# Patient Record
Sex: Female | Born: 1980 | Race: White | Hispanic: No | Marital: Married | Smoking: Former smoker
Health system: Southern US, Community
[De-identification: ages and names within clinical notes are randomized; demographics above are authoritative.]

## PROBLEM LIST (undated history)

## (undated) ENCOUNTER — Inpatient Hospital Stay (HOSPITAL_COMMUNITY): Payer: Self-pay

## (undated) DIAGNOSIS — I10 Essential (primary) hypertension: Secondary | ICD-10-CM

## (undated) DIAGNOSIS — O1410 Severe pre-eclampsia, unspecified trimester: Secondary | ICD-10-CM

## (undated) DIAGNOSIS — E079 Disorder of thyroid, unspecified: Secondary | ICD-10-CM

## (undated) DIAGNOSIS — E063 Autoimmune thyroiditis: Secondary | ICD-10-CM

## (undated) DIAGNOSIS — R87629 Unspecified abnormal cytological findings in specimens from vagina: Secondary | ICD-10-CM

## (undated) DIAGNOSIS — F419 Anxiety disorder, unspecified: Secondary | ICD-10-CM

## (undated) HISTORY — DX: Disorder of thyroid, unspecified: E07.9

## (undated) HISTORY — PX: COSMETIC SURGERY: SHX468

## (undated) HISTORY — DX: Anxiety disorder, unspecified: F41.9

## (undated) HISTORY — PX: AUGMENTATION MAMMAPLASTY: SUR837

## (undated) HISTORY — PX: BREAST SURGERY: SHX581

## (undated) HISTORY — DX: Autoimmune thyroiditis: E06.3

## (undated) HISTORY — DX: Severe pre-eclampsia, unspecified trimester: O14.10

---
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
## 2003-04-14 ENCOUNTER — Emergency Department (HOSPITAL_COMMUNITY): Admission: EM | Admit: 2003-04-14 | Discharge: 2003-04-14 | Payer: Self-pay | Admitting: Emergency Medicine

## 2004-04-26 ENCOUNTER — Other Ambulatory Visit: Admission: RE | Admit: 2004-04-26 | Discharge: 2004-04-26 | Payer: Self-pay | Admitting: Family Medicine

## 2005-02-15 ENCOUNTER — Other Ambulatory Visit: Admission: RE | Admit: 2005-02-15 | Discharge: 2005-02-15 | Payer: Self-pay | Admitting: Obstetrics and Gynecology

## 2006-03-27 ENCOUNTER — Other Ambulatory Visit: Admission: RE | Admit: 2006-03-27 | Discharge: 2006-03-27 | Payer: Self-pay | Admitting: Obstetrics & Gynecology

## 2007-04-18 ENCOUNTER — Other Ambulatory Visit: Admission: RE | Admit: 2007-04-18 | Discharge: 2007-04-18 | Payer: Self-pay | Admitting: Obstetrics & Gynecology

## 2008-05-01 ENCOUNTER — Other Ambulatory Visit: Admission: RE | Admit: 2008-05-01 | Discharge: 2008-05-01 | Payer: Self-pay | Admitting: Obstetrics & Gynecology

## 2010-04-21 ENCOUNTER — Other Ambulatory Visit (HOSPITAL_COMMUNITY)
Admission: RE | Admit: 2010-04-21 | Discharge: 2010-04-21 | Disposition: A | Discharge status: Home / Self Care | Payer: BC Managed Care – PPO | Source: Ambulatory Visit | Attending: Obstetrics and Gynecology | Admitting: Obstetrics and Gynecology

## 2010-04-21 ENCOUNTER — Other Ambulatory Visit: Payer: Self-pay | Admitting: Obstetrics and Gynecology

## 2010-04-21 DIAGNOSIS — Z01419 Encounter for gynecological examination (general) (routine) without abnormal findings: Secondary | ICD-10-CM | POA: Insufficient documentation

## 2010-04-21 DIAGNOSIS — Z113 Encounter for screening for infections with a predominantly sexual mode of transmission: Secondary | ICD-10-CM | POA: Insufficient documentation

## 2010-04-21 DIAGNOSIS — Z1159 Encounter for screening for other viral diseases: Secondary | ICD-10-CM | POA: Insufficient documentation

## 2011-04-27 ENCOUNTER — Other Ambulatory Visit: Payer: Self-pay | Admitting: Obstetrics and Gynecology

## 2011-04-27 ENCOUNTER — Other Ambulatory Visit (HOSPITAL_COMMUNITY)
Admission: RE | Admit: 2011-04-27 | Discharge: 2011-04-27 | Disposition: A | Discharge status: Home / Self Care | Payer: BC Managed Care – PPO | Source: Ambulatory Visit | Attending: Obstetrics and Gynecology | Admitting: Obstetrics and Gynecology

## 2011-04-27 DIAGNOSIS — Z01419 Encounter for gynecological examination (general) (routine) without abnormal findings: Secondary | ICD-10-CM | POA: Insufficient documentation

## 2012-06-30 ENCOUNTER — Emergency Department: Payer: Self-pay | Admitting: Emergency Medicine

## 2012-06-30 LAB — COMPREHENSIVE METABOLIC PANEL
Albumin: 3.9 g/dL (ref 3.4–5.0)
Alkaline Phosphatase: 61 U/L (ref 50–136)
Bilirubin,Total: 0.8 mg/dL (ref 0.2–1.0)
Co2: 24 mmol/L (ref 21–32)
Creatinine: 0.59 mg/dL — ABNORMAL LOW (ref 0.60–1.30)
EGFR (African American): >60
Glucose: 120 mg/dL — ABNORMAL HIGH (ref 65–99)
SGOT(AST): 31 U/L (ref 15–37)
SGPT (ALT): 41 U/L (ref 12–78)
Total Protein: 7.1 g/dL (ref 6.4–8.2)

## 2012-06-30 LAB — URINALYSIS, COMPLETE
Bacteria: NONE SEEN
Bilirubin,UR: NEGATIVE
Blood: NEGATIVE
Glucose,UR: NEGATIVE mg/dL (ref 0–75)
Leukocyte Esterase: NEGATIVE
Nitrite: NEGATIVE
Protein: NEGATIVE
RBC,UR: 2 /HPF (ref 0–5)
Squamous Epithelial: 3
WBC UR: 1 /HPF (ref 0–5)

## 2012-06-30 LAB — CBC
HCT: 38.5 % (ref 35.0–47.0)
MCV: 92 fL (ref 80–100)
RBC: 4.18 10*6/uL (ref 3.80–5.20)
WBC: 8.5 10*3/uL (ref 3.6–11.0)

## 2012-11-26 ENCOUNTER — Other Ambulatory Visit (HOSPITAL_COMMUNITY)
Admission: RE | Admit: 2012-11-26 | Discharge: 2012-11-26 | Disposition: A | Discharge status: Home / Self Care | Payer: 59 | Source: Ambulatory Visit | Attending: Obstetrics and Gynecology | Admitting: Obstetrics and Gynecology

## 2012-11-26 ENCOUNTER — Other Ambulatory Visit: Payer: Self-pay | Admitting: Obstetrics and Gynecology

## 2012-11-26 DIAGNOSIS — Z01419 Encounter for gynecological examination (general) (routine) without abnormal findings: Secondary | ICD-10-CM | POA: Insufficient documentation

## 2013-01-02 LAB — OB RESULTS CONSOLE RPR: RPR: NONREACTIVE

## 2013-01-02 LAB — OB RESULTS CONSOLE ABO/RH: RH Type: POSITIVE

## 2013-01-02 LAB — OB RESULTS CONSOLE HIV ANTIBODY (ROUTINE TESTING): HIV: NONREACTIVE

## 2013-01-03 LAB — OB RESULTS CONSOLE RUBELLA ANTIBODY, IGM: Rubella: IMMUNE

## 2013-01-27 LAB — OB RESULTS CONSOLE GC/CHLAMYDIA
Chlamydia: NEGATIVE
Gonorrhea: NEGATIVE

## 2013-06-02 LAB — OB RESULTS CONSOLE RPR: RPR: NONREACTIVE

## 2013-07-29 ENCOUNTER — Observation Stay: Payer: Self-pay | Admitting: Obstetrics and Gynecology

## 2013-08-04 LAB — OB RESULTS CONSOLE GBS: GBS: NEGATIVE

## 2013-08-19 ENCOUNTER — Encounter (HOSPITAL_COMMUNITY): Payer: Self-pay | Admitting: *Deleted

## 2013-08-19 ENCOUNTER — Inpatient Hospital Stay (HOSPITAL_COMMUNITY)
Admission: AD | Admit: 2013-08-19 | Discharge: 2013-08-23 | DRG: 765 | Disposition: A | Discharge status: Home / Self Care | Payer: 59 | Source: Ambulatory Visit | Attending: Obstetrics and Gynecology | Admitting: Obstetrics and Gynecology

## 2013-08-19 ENCOUNTER — Other Ambulatory Visit: Payer: Self-pay | Admitting: Obstetrics and Gynecology

## 2013-08-19 DIAGNOSIS — O411090 Infection of amniotic sac and membranes, unspecified, unspecified trimester, not applicable or unspecified: Secondary | ICD-10-CM | POA: Diagnosis present

## 2013-08-19 DIAGNOSIS — D649 Anemia, unspecified: Secondary | ICD-10-CM | POA: Diagnosis present

## 2013-08-19 DIAGNOSIS — Z98891 History of uterine scar from previous surgery: Secondary | ICD-10-CM

## 2013-08-19 DIAGNOSIS — O139 Gestational [pregnancy-induced] hypertension without significant proteinuria, unspecified trimester: Secondary | ICD-10-CM | POA: Diagnosis present

## 2013-08-19 DIAGNOSIS — O9903 Anemia complicating the puerperium: Secondary | ICD-10-CM | POA: Diagnosis present

## 2013-08-19 DIAGNOSIS — O411290 Chorioamnionitis, unspecified trimester, not applicable or unspecified: Secondary | ICD-10-CM | POA: Diagnosis not present

## 2013-08-19 HISTORY — DX: Unspecified abnormal cytological findings in specimens from vagina: R87.629

## 2013-08-19 LAB — COMPREHENSIVE METABOLIC PANEL
ALBUMIN: 2.2 g/dL — AB (ref 3.5–5.2)
ALT: 17 U/L (ref 0–35)
AST: 29 U/L (ref 0–37)
Alkaline Phosphatase: 106 U/L (ref 39–117)
BUN: 11 mg/dL (ref 6–23)
CALCIUM: 9 mg/dL (ref 8.4–10.5)
CO2: 19 mEq/L (ref 19–32)
CREATININE: 0.63 mg/dL (ref 0.50–1.10)
Chloride: 102 mEq/L (ref 96–112)
GFR calc Af Amer: >90 mL/min (ref >90)
GFR calc non Af Amer: >90 mL/min (ref >90)
Glucose, Bld: 90 mg/dL (ref 70–99)
POTASSIUM: 4.5 meq/L (ref 3.7–5.3)
Sodium: 135 mEq/L — ABNORMAL LOW (ref 137–147)
Total Bilirubin: 0.2 mg/dL — ABNORMAL LOW (ref 0.3–1.2)
Total Protein: 5.7 g/dL — ABNORMAL LOW (ref 6.0–8.3)

## 2013-08-19 LAB — CBC
HEMATOCRIT: 34.6 % — AB (ref 36.0–46.0)
HEMOGLOBIN: 11.9 g/dL — AB (ref 12.0–15.0)
MCH: 32.3 pg (ref 26.0–34.0)
MCHC: 34.4 g/dL (ref 30.0–36.0)
MCV: 94 fL (ref 78.0–100.0)
Platelets: 167 10*3/uL (ref 150–400)
RBC: 3.68 MIL/uL — AB (ref 3.87–5.11)
RDW: 13.5 % (ref 11.5–15.5)
WBC: 15.3 10*3/uL — AB (ref 4.0–10.5)

## 2013-08-19 LAB — ABO/RH: ABO/RH(D): A POS

## 2013-08-19 LAB — PROTEIN / CREATININE RATIO, URINE
CREATININE, URINE: 50.64 mg/dL
PROTEIN CREATININE RATIO: 0.09 (ref 0.00–0.15)
TOTAL PROTEIN, URINE: 4.8 mg/dL

## 2013-08-19 LAB — RPR

## 2013-08-19 LAB — TYPE AND SCREEN
ABO/RH(D): A POS
Antibody Screen: NEGATIVE

## 2013-08-19 LAB — URIC ACID: URIC ACID, SERUM: 6 mg/dL (ref 2.4–7.0)

## 2013-08-19 MED ORDER — FLEET ENEMA 7-19 GM/118ML RE ENEM: 1.0000 | ENEMA | RECTAL | Status: DC | PRN

## 2013-08-19 MED ORDER — MISOPROSTOL 25 MCG QUARTER TABLET
25.0000 ug | ORAL_TABLET | ORAL | Status: DC | PRN
  Administered 2013-08-19 (×2): 25 ug via VAGINAL
  Filled 2013-08-19 (×3): qty 0.25

## 2013-08-19 MED ORDER — OXYTOCIN BOLUS FROM INFUSION: 500.0000 mL | INTRAVENOUS | Status: DC

## 2013-08-19 MED ORDER — OXYCODONE-ACETAMINOPHEN 5-325 MG PO TABS: 1.0000 | ORAL_TABLET | ORAL | Status: DC | PRN

## 2013-08-19 MED ORDER — IBUPROFEN 600 MG PO TABS: 600.0000 mg | ORAL_TABLET | Freq: Four times a day (QID) | ORAL | Status: DC | PRN

## 2013-08-19 MED ORDER — LIDOCAINE HCL (PF) 1 % IJ SOLN: 30.0000 mL | INTRAMUSCULAR | Status: DC | PRN

## 2013-08-19 MED ORDER — ACETAMINOPHEN 325 MG PO TABS: 650.0000 mg | ORAL_TABLET | ORAL | Status: DC | PRN

## 2013-08-19 MED ORDER — CITRIC ACID-SODIUM CITRATE 334-500 MG/5ML PO SOLN
30.0000 mL | ORAL | Status: DC | PRN
  Administered 2013-08-20: 30 mL via ORAL
  Filled 2013-08-19: qty 15

## 2013-08-19 MED ORDER — ONDANSETRON HCL 4 MG/2ML IJ SOLN: 4.0000 mg | Freq: Four times a day (QID) | INTRAMUSCULAR | Status: DC | PRN

## 2013-08-19 MED ORDER — LACTATED RINGERS IV SOLN
500.0000 mL | INTRAVENOUS | Status: DC | PRN
  Administered 2013-08-20: 500 mL via INTRAVENOUS

## 2013-08-19 MED ORDER — OXYTOCIN 40 UNITS IN LACTATED RINGERS INFUSION - SIMPLE MED: 62.5000 mL/h | INTRAVENOUS | Status: DC

## 2013-08-19 MED ORDER — TERBUTALINE SULFATE 1 MG/ML IJ SOLN: 0.2500 mg | Freq: Once | INTRAMUSCULAR | Status: AC | PRN

## 2013-08-19 MED ORDER — LACTATED RINGERS IV SOLN
INTRAVENOUS | Status: DC
  Administered 2013-08-19 – 2013-08-20 (×6): via INTRAVENOUS

## 2013-08-19 NOTE — H&P (Signed)
Melinda [name] is a 33 y.o. female G2P0010 at 38 wks anDoreene Nestd 2 days by LMP of 11/24/2013 confirmed by  8 wk ultrasound with EDD 08/31/2013.  Prenatal care at [facility] with Dr. Richardson Doppole.  Pt presented to the office with BP 152/ 96  She was sent to labor and delivery for induction due to gestational hypertension. She denis headache blurred vision or ruq pain. + FM no contractions no lof. No vaginal bleeding.     Maternal Medical History:  Fetal activity: Perceived fetal activity is normal.   Last perceived fetal movement was within the past hour.      OB History   Grav Para Term Preterm Abortions TAB SAB Ect Mult Living   1              Current pregnancy.. Pt entered care at 8 wks ...  She had an ultrasound 08/05/2013 EFW was 6 lb 8 oz ( 70%ile) AFI 17 Vertex presentation.    PMH negative   History reviewed. No pertinent past medical history. Past Surgical History  Procedure Laterality Date  . Breast surgery      implants   Family History: family history includes Alcohol abuse in her father; Cancer in her maternal grandmother and paternal grandmother; Varicose Veins in her maternal grandmother. There is no history of Arthritis, Asthma, Birth defects, COPD, Depression, Diabetes, Drug abuse, Early death, Hearing loss, Heart disease, Hyperlipidemia, Hypertension, Kidney disease, Learning disabilities, Mental illness, Mental retardation, Miscarriages / Stillbirths, Stroke, or Vision loss. Social History:  reports that she has never smoked. She has never used smokeless tobacco. She reports that she does not drink alcohol or use illicit drugs.   Prenatal Transfer Tool  Maternal Diabetes: No Genetic Screening: Normal Maternal Ultrasounds/Referrals: Normal Fetal Ultrasounds or other Referrals:  None Maternal Substance Abuse:  No Significant Maternal Medications:  None Significant Maternal Lab Results:  Lab values include: Group B Strep negative Other Comments:  None  ROS  Dilation:  1 Effacement (%): 50 Station: -3 Exam by:: Camelia EngK. Haynes, RN Blood pressure 142/84, pulse 77, temperature 98.4 F (36.9 C), temperature source Oral, resp. rate 20, height 5\' 7"  (1.702 m), weight 105.688 kg (233 lb), last menstrual period 11/24/2012. Maternal Exam:  Uterine Assessment: Contraction strength is mild.  Contraction frequency is irregular and rare.   Abdomen: Patient reports no abdominal tenderness. Fundal height is 38.   Estimated fetal weight is 7 1/2 to 8 lbs .   Fetal presentation: vertex  Introitus: Normal vulva. Normal vagina.  Pelvis: adequate for delivery.   Cervix: Cervix evaluated by digital exam.     Fetal Exam Fetal Monitor Review: Baseline rate: 140.  Variability: moderate (6-25 bpm).   Pattern: accelerations present and no decelerations.    Fetal State Assessment: Category I - tracings are normal.     Physical Exam  Vitals reviewed. Constitutional: She is oriented to person, place, and time. She appears well-developed and well-nourished.  HENT:  Head: Normocephalic and atraumatic.  Neck: Normal range of motion.  Cardiovascular: Normal rate and regular rhythm.   Respiratory: Effort normal and breath sounds normal.  Genitourinary: Vagina normal.  Musculoskeletal: Normal range of motion.  Neurological: She is alert and oriented to person, place, and time.  Skin: Skin is warm and dry.  Psychiatric: She has a normal mood and affect.    Prenatal labs: ABO, Rh: --/--/A POS, A POS (06/23 1110) Antibody: NEG (06/23 1110) Rubella:  immune   RPR:  Nonreactive   HBsAg:  Negative  HIV:   Nonreactive  GBS:   Negative   Assessment/Plan: 38 wks and 2 days with gestational hypertension... Will check PIH labs to rule out preeclampsia. Admit to labor and delivery for induction. Increased r/o cesarean section with induction was discussed and she accepts this risk  Plan cytotec for cervical ripening  Anticipate SVD Dr. Charlotta Newtonzan covering after 5 pm on  08/19/2013   COLE,TARA J. 08/19/2013, 4:18 PM

## 2013-08-19 NOTE — Progress Notes (Signed)
Reported more than 3 UCs in 10 minutes so unable to place cytotec. Patient rates them mild 3/10 pain. Reported baseline returned to 145 after baseline of 155-165. Orders received to continue to try to place cytotec per UC pattern until 3am. If unable to place cytotec start pitocin at 5am.

## 2013-08-19 NOTE — Progress Notes (Signed)
Notified patient request to ambulate. Dr. Charlotta Newtonzan said patient can ambulate until 10:15 and then assess UC pattern for  cytotec placement by 11pm.

## 2013-08-20 ENCOUNTER — Inpatient Hospital Stay (HOSPITAL_COMMUNITY): Payer: 59 | Admitting: Anesthesiology

## 2013-08-20 ENCOUNTER — Encounter (HOSPITAL_COMMUNITY): Payer: Self-pay | Admitting: Anesthesiology

## 2013-08-20 ENCOUNTER — Encounter (HOSPITAL_COMMUNITY): Payer: 59 | Admitting: Anesthesiology

## 2013-08-20 ENCOUNTER — Encounter (HOSPITAL_COMMUNITY)
Admission: AD | Disposition: A | Discharge status: Home / Self Care | Payer: Self-pay | Source: Ambulatory Visit | Attending: Obstetrics and Gynecology

## 2013-08-20 LAB — CBC
HCT: 34.1 % — ABNORMAL LOW (ref 36.0–46.0)
HCT: 34.3 % — ABNORMAL LOW (ref 36.0–46.0)
HCT: 34.4 % — ABNORMAL LOW (ref 36.0–46.0)
HEMOGLOBIN: 11.9 g/dL — AB (ref 12.0–15.0)
Hemoglobin: 11.5 g/dL — ABNORMAL LOW (ref 12.0–15.0)
Hemoglobin: 11.8 g/dL — ABNORMAL LOW (ref 12.0–15.0)
MCH: 32.1 pg (ref 26.0–34.0)
MCH: 32.4 pg (ref 26.0–34.0)
MCH: 32.7 pg (ref 26.0–34.0)
MCHC: 34.4 g/dL (ref 30.0–36.0)
MCHC: 34.6 g/dL (ref 30.0–36.0)
MCHC: 33.7 g/dL (ref 30.0–36.0)
MCV: 94.2 fL (ref 78.0–100.0)
MCV: 94.5 fL (ref 78.0–100.0)
MCV: 95.3 fL (ref 78.0–100.0)
PLATELETS: 150 10*3/uL (ref 150–400)
PLATELETS: 159 10*3/uL (ref 150–400)
Platelets: 149 10*3/uL — ABNORMAL LOW (ref 150–400)
RBC: 3.58 MIL/uL — ABNORMAL LOW (ref 3.87–5.11)
RBC: 3.64 MIL/uL — ABNORMAL LOW (ref 3.87–5.11)
RBC: 3.64 MIL/uL — AB (ref 3.87–5.11)
RDW: 13.7 % (ref 11.5–15.5)
RDW: 13.5 % (ref 11.5–15.5)
RDW: 13.5 % (ref 11.5–15.5)
WBC: 15.2 10*3/uL — ABNORMAL HIGH (ref 4.0–10.5)
WBC: 15.1 10*3/uL — AB (ref 4.0–10.5)
WBC: 14.7 10*3/uL — ABNORMAL HIGH (ref 4.0–10.5)

## 2013-08-20 SURGERY — Surgical Case
Anesthesia: Epidural

## 2013-08-20 MED ORDER — LACTATED RINGERS IV SOLN
INTRAVENOUS | Status: DC
  Administered 2013-08-20: 22:00:00 via INTRAUTERINE

## 2013-08-20 MED ORDER — PHENYLEPHRINE 40 MCG/ML (10ML) SYRINGE FOR IV PUSH (FOR BLOOD PRESSURE SUPPORT): 80.0000 ug | PREFILLED_SYRINGE | INTRAVENOUS | Status: DC | PRN

## 2013-08-20 MED ORDER — SODIUM CHLORIDE 0.9 % IV SOLN
3.0000 g | Freq: Four times a day (QID) | INTRAVENOUS | Status: DC
  Administered 2013-08-20: 3 g via INTRAVENOUS
  Filled 2013-08-20 (×3): qty 3

## 2013-08-20 MED ORDER — ONDANSETRON HCL 4 MG/2ML IJ SOLN
INTRAMUSCULAR | Status: AC
  Filled 2013-08-20: qty 2

## 2013-08-20 MED ORDER — MORPHINE SULFATE 0.5 MG/ML IJ SOLN
INTRAMUSCULAR | Status: AC
  Filled 2013-08-20: qty 10

## 2013-08-20 MED ORDER — KETOROLAC TROMETHAMINE 30 MG/ML IJ SOLN
30.0000 mg | Freq: Four times a day (QID) | INTRAMUSCULAR | Status: DC | PRN
  Administered 2013-08-21: 30 mg via INTRAMUSCULAR

## 2013-08-20 MED ORDER — LIDOCAINE HCL (PF) 1 % IJ SOLN
INTRAMUSCULAR | Status: DC | PRN
  Administered 2013-08-20 (×4): 4 mL

## 2013-08-20 MED ORDER — EPHEDRINE 5 MG/ML INJ
10.0000 mg | INTRAVENOUS | Status: DC | PRN
  Filled 2013-08-20: qty 4

## 2013-08-20 MED ORDER — FENTANYL CITRATE 0.05 MG/ML IJ SOLN
25.0000 ug | INTRAMUSCULAR | Status: DC | PRN
  Administered 2013-08-21 (×2): 50 ug via INTRAVENOUS

## 2013-08-20 MED ORDER — FENTANYL 2.5 MCG/ML BUPIVACAINE 1/10 % EPIDURAL INFUSION (WH - ANES)
14.0000 mL/h | INTRAMUSCULAR | Status: DC | PRN
  Administered 2013-08-20 (×3): 14 mL/h via EPIDURAL
  Filled 2013-08-20 (×2): qty 125

## 2013-08-20 MED ORDER — MEPERIDINE HCL 25 MG/ML IJ SOLN
INTRAMUSCULAR | Status: AC
  Filled 2013-08-20: qty 1

## 2013-08-20 MED ORDER — OXYTOCIN 40 UNITS IN LACTATED RINGERS INFUSION - SIMPLE MED
1.0000 m[IU]/min | INTRAVENOUS | Status: DC
  Administered 2013-08-20: 2 m[IU]/min via INTRAVENOUS
  Filled 2013-08-20: qty 1000

## 2013-08-20 MED ORDER — DIPHENHYDRAMINE HCL 50 MG/ML IJ SOLN: 12.5000 mg | INTRAMUSCULAR | Status: DC | PRN

## 2013-08-20 MED ORDER — LIDOCAINE-EPINEPHRINE (PF) 2 %-1:200000 IJ SOLN
INTRAMUSCULAR | Status: DC | PRN
  Administered 2013-08-20: 5 mL via EPIDURAL
  Administered 2013-08-20: 10 mL via EPIDURAL

## 2013-08-20 MED ORDER — SCOPOLAMINE 1 MG/3DAYS TD PT72
1.0000 | MEDICATED_PATCH | Freq: Once | TRANSDERMAL | Status: DC
  Administered 2013-08-20: 1.5 mg via TRANSDERMAL

## 2013-08-20 MED ORDER — LACTATED RINGERS IV SOLN
500.0000 mL | Freq: Once | INTRAVENOUS | Status: AC
  Administered 2013-08-20: 500 mL via INTRAVENOUS

## 2013-08-20 MED ORDER — EPHEDRINE 5 MG/ML INJ: 10.0000 mg | INTRAVENOUS | Status: DC | PRN

## 2013-08-20 MED ORDER — OXYTOCIN 10 UNIT/ML IJ SOLN
40.0000 [IU] | INTRAVENOUS | Status: DC | PRN
  Administered 2013-08-20: 40 [IU] via INTRAVENOUS

## 2013-08-20 MED ORDER — SCOPOLAMINE 1 MG/3DAYS TD PT72
MEDICATED_PATCH | TRANSDERMAL | Status: AC
Start: 2013-08-20 — End: 2013-08-21
  Filled 2013-08-20: qty 1

## 2013-08-20 MED ORDER — SODIUM BICARBONATE 8.4 % IV SOLN
INTRAVENOUS | Status: AC
  Filled 2013-08-20: qty 50

## 2013-08-20 MED ORDER — DEXAMETHASONE SODIUM PHOSPHATE 10 MG/ML IJ SOLN
INTRAMUSCULAR | Status: DC | PRN
  Administered 2013-08-20: 10 mg via INTRAVENOUS

## 2013-08-20 MED ORDER — OXYTOCIN 10 UNIT/ML IJ SOLN
INTRAMUSCULAR | Status: AC
  Filled 2013-08-20: qty 4

## 2013-08-20 MED ORDER — CARBOPROST TROMETHAMINE 250 MCG/ML IM SOLN
INTRAMUSCULAR | Status: AC
  Filled 2013-08-20: qty 1

## 2013-08-20 MED ORDER — KETOROLAC TROMETHAMINE 30 MG/ML IJ SOLN
30.0000 mg | Freq: Four times a day (QID) | INTRAMUSCULAR | Status: DC | PRN
Start: 2013-08-20 — End: 2013-08-21

## 2013-08-20 MED ORDER — CLINDAMYCIN PHOSPHATE 900 MG/50ML IV SOLN
900.0000 mg | Freq: Once | INTRAVENOUS | Status: AC
  Administered 2013-08-20: 900 mg via INTRAVENOUS
  Filled 2013-08-20: qty 50

## 2013-08-20 MED ORDER — PHENYLEPHRINE 40 MCG/ML (10ML) SYRINGE FOR IV PUSH (FOR BLOOD PRESSURE SUPPORT)
80.0000 ug | PREFILLED_SYRINGE | INTRAVENOUS | Status: DC | PRN
  Filled 2013-08-20: qty 10

## 2013-08-20 MED ORDER — PROMETHAZINE HCL 25 MG/ML IJ SOLN: 6.2500 mg | INTRAMUSCULAR | Status: DC | PRN

## 2013-08-20 MED ORDER — SODIUM CHLORIDE 0.9 % IV SOLN: 3.0000 g | Freq: Once | INTRAVENOUS | Status: DC

## 2013-08-20 MED ORDER — ACETAMINOPHEN 500 MG PO TABS
1000.0000 mg | ORAL_TABLET | Freq: Four times a day (QID) | ORAL | Status: DC | PRN
  Administered 2013-08-20: 1000 mg via ORAL
  Filled 2013-08-20: qty 2

## 2013-08-20 MED ORDER — ONDANSETRON HCL 4 MG/2ML IJ SOLN
INTRAMUSCULAR | Status: DC | PRN
  Administered 2013-08-20: 4 mg via INTRAVENOUS

## 2013-08-20 MED ORDER — MORPHINE SULFATE (PF) 0.5 MG/ML IJ SOLN
INTRAMUSCULAR | Status: DC | PRN
  Administered 2013-08-20: 1 mg via INTRAVENOUS
  Administered 2013-08-20: 4 mg via EPIDURAL

## 2013-08-20 MED ORDER — DEXAMETHASONE SODIUM PHOSPHATE 10 MG/ML IJ SOLN
INTRAMUSCULAR | Status: AC
  Filled 2013-08-20: qty 1

## 2013-08-20 MED ORDER — LIDOCAINE-EPINEPHRINE (PF) 2 %-1:200000 IJ SOLN
INTRAMUSCULAR | Status: AC
  Filled 2013-08-20: qty 20

## 2013-08-20 MED ORDER — MEPERIDINE HCL 25 MG/ML IJ SOLN
INTRAMUSCULAR | Status: DC | PRN
  Administered 2013-08-20: 12.5 mg via INTRAVENOUS

## 2013-08-20 MED ORDER — MEPERIDINE HCL 25 MG/ML IJ SOLN
6.2500 mg | INTRAMUSCULAR | Status: DC | PRN
Start: 2013-08-20 — End: 2013-08-21

## 2013-08-20 MED ORDER — CARBOPROST TROMETHAMINE 250 MCG/ML IM SOLN
INTRAMUSCULAR | Status: DC | PRN
  Administered 2013-08-20: 250 ug via INTRAMUSCULAR

## 2013-08-20 SURGICAL SUPPLY — 45 items
APL SKNCLS STERI-STRIP NONHPOA (GAUZE/BANDAGES/DRESSINGS) ×1
BARRIER ADHS 3X4 INTERCEED (GAUZE/BANDAGES/DRESSINGS) ×2 IMPLANT
BENZOIN TINCTURE PRP APPL 2/3 (GAUZE/BANDAGES/DRESSINGS) ×3 IMPLANT
BRR ADH 4X3 ABS CNTRL BYND (GAUZE/BANDAGES/DRESSINGS) ×1
CLAMP CORD UMBIL (MISCELLANEOUS) IMPLANT
CLOSURE WOUND 1/2 X4 (GAUZE/BANDAGES/DRESSINGS) ×1
CLOTH BEACON ORANGE TIMEOUT ST (SAFETY) ×3 IMPLANT
CONTAINER PREFILL 10% NBF 15ML (MISCELLANEOUS) IMPLANT
DRAPE LG THREE QUARTER DISP (DRAPES) IMPLANT
DRSG OPSITE POSTOP 4X10 (GAUZE/BANDAGES/DRESSINGS) ×3 IMPLANT
DURAPREP 26ML APPLICATOR (WOUND CARE) ×3 IMPLANT
ELECT REM PT RETURN 9FT ADLT (ELECTROSURGICAL) ×3
ELECTRODE REM PT RTRN 9FT ADLT (ELECTROSURGICAL) ×1 IMPLANT
EXTRACTOR VACUUM M CUP 4 TUBE (SUCTIONS) IMPLANT
EXTRACTOR VACUUM M CUP 4' TUBE (SUCTIONS)
GLOVE BIOGEL M 6.5 STRL (GLOVE) ×6 IMPLANT
GLOVE BIOGEL PI IND STRL 6.5 (GLOVE) ×1 IMPLANT
GLOVE BIOGEL PI INDICATOR 6.5 (GLOVE) ×2
GOWN STRL REUS W/TWL LRG LVL3 (GOWN DISPOSABLE) ×9 IMPLANT
KIT ABG SYR 3ML LUER SLIP (SYRINGE) IMPLANT
NDL HYPO 25X5/8 SAFETYGLIDE (NEEDLE) IMPLANT
NEEDLE HYPO 25X5/8 SAFETYGLIDE (NEEDLE) IMPLANT
NS IRRIG 1000ML POUR BTL (IV SOLUTION) ×3 IMPLANT
PACK C SECTION WH (CUSTOM PROCEDURE TRAY) ×3 IMPLANT
PAD ABD 8X7 1/2 STERILE (GAUZE/BANDAGES/DRESSINGS) ×2 IMPLANT
PAD OB MATERNITY 4.3X12.25 (PERSONAL CARE ITEMS) ×3 IMPLANT
RTRCTR C-SECT PINK 25CM LRG (MISCELLANEOUS) IMPLANT
RTRCTR C-SECT PINK 34CM XLRG (MISCELLANEOUS) IMPLANT
SPONGE GAUZE 4X4 12PLY (GAUZE/BANDAGES/DRESSINGS) ×2 IMPLANT
STAPLER VISISTAT 35W (STAPLE) IMPLANT
STRIP CLOSURE SKIN 1/2X4 (GAUZE/BANDAGES/DRESSINGS) ×2 IMPLANT
SUT PDS AB 0 CT1 27 (SUTURE) ×6 IMPLANT
SUT PLAIN 0 NONE (SUTURE) IMPLANT
SUT VIC AB 0 CTX 36 (SUTURE) ×9
SUT VIC AB 0 CTX36XBRD ANBCTRL (SUTURE) ×3 IMPLANT
SUT VIC AB 2-0 CT1 27 (SUTURE) ×3
SUT VIC AB 2-0 CT1 TAPERPNT 27 (SUTURE) ×1 IMPLANT
SUT VIC AB 2-0 SH 27 (SUTURE) ×3
SUT VIC AB 2-0 SH 27XBRD (SUTURE) IMPLANT
SUT VIC AB 3-0 SH 27 (SUTURE)
SUT VIC AB 3-0 SH 27X BRD (SUTURE) IMPLANT
SUT VIC AB 4-0 KS 27 (SUTURE) ×3 IMPLANT
TOWEL OR 17X24 6PK STRL BLUE (TOWEL DISPOSABLE) ×3 IMPLANT
TRAY FOLEY CATH 14FR (SET/KITS/TRAYS/PACK) ×3 IMPLANT
WATER STERILE IRR 1000ML POUR (IV SOLUTION) ×3 IMPLANT

## 2013-08-20 NOTE — Progress Notes (Signed)
Melinda [name] is a 33 y.o. G1P0 at 851w3d by LMP admitted for induction of labor due to gestational hypertension .  Subjective: Pt comfortable with epidural   Objective: BP 161/84  Pulse 88  Temp(Src) 98.7 F (37.1 C) (Oral)  Resp 18  Ht 5\' 7"  (1.702 m)  Wt 105.688 kg (233 lb)  BMI 36.48 kg/m2  LMP 11/24/2012 I/O last 3 completed shifts: In: -  Out: 350 [Urine:350]    FHT:  FHR: 155 bpm, variability: moderate,  accelerations:  Present,  decelerations:  Absent UC:   regular, every 2 minutes SVE:   Dilation: 6 Effacement (%): 100 Station: -1 Exam by:: cole md  Labs: Lab Results  Component Value Date   WBC 14.7* 08/20/2013   HGB 11.9* 08/20/2013   HCT 34.4* 08/20/2013   MCV 94.5 08/20/2013   PLT 149* 08/20/2013    Assessment / Plan: 38 wks and 2 days with gestational hypertension and chorioamnionitis.   Labor: continue pitocin as tolerated  Preeclampsia:  NA Fetal Wellbeing:  Category I Pain Control:  Epidural I/D:  unasyn  Anticipated MOD:   FHR improved with amnioinfusion/ unasyn and tylenol. Currently category 1 .. Will continue with expectant management for now and monitor closely.   COLE,TARA J. 08/20/2013, 8:49 PM

## 2013-08-20 NOTE — Transfer of Care (Signed)
Immediate Anesthesia Transfer of Care Note  Patient: Melinda [name]  Procedure(s) Performed: Procedure(s): CESAREAN SECTION (N/A)  Patient Location: PACU  Anesthesia Type:Epidural  Level of Consciousness: awake, alert , oriented and patient cooperative  Airway & Oxygen Therapy: Patient Spontanous Breathing  Post-op Assessment: Report given to PACU RN and Post -op Vital signs reviewed and stable  Post vital signs: Reviewed and stable  Complications: No apparent anesthesia complications

## 2013-08-20 NOTE — Progress Notes (Signed)
Melinda [name] is a 33 y.o. G1P0 at 6133w3d by LMP admitted for induction of labor due to gestational hypertension .  Subjective:  pt without complaints... She does not feel her contractions. +FM no vaginal bleeding no leakage of fluid   Objective: BP 135/68  Pulse 76  Temp(Src) 98.4 F (36.9 C) (Oral)  Resp 18  Ht 5\' 7"  (1.702 m)  Wt 105.688 kg (233 lb)  BMI 36.48 kg/m2  LMP 11/24/2012      FHT:  Baseline 135 with moderate variability accelerations are present no decelerations are noted  UC:   regular, every 2-4 minutes SVE:   1.5/70/-2 AROM clear fluid ... IUPC placed  Labs: Lab Results  Component Value Date   WBC 15.1* 08/20/2013   HGB 11.8* 08/20/2013   HCT 34.3* 08/20/2013   MCV 94.2 08/20/2013   PLT 150 08/20/2013    Assessment / Plan: 38 wks and 3 days with gestational hypertension   Labor: AROM performed will continue to increase pitocin as tolerated to reach MVU's of at least 200 Preeclampsia:  NA Fetal Wellbeing:  Category I Pain Control:  Epidural upon request  I/D:  n/a Anticipated MOD:  NSVD  COLE,TARA J. 08/20/2013, 8:09 AM

## 2013-08-20 NOTE — Progress Notes (Signed)
Dr Richardson Doppole notified of patient progress, SVE, MVUs, amt of pit, FHr tracing with changing baseline, mat temp, and Lr bolus.  Orders for anbx

## 2013-08-20 NOTE — Progress Notes (Signed)
Informed baseline fetal heart rate is 175 with minimal variability despite position change and fluid bolus. Can't increase pitocin at this time.

## 2013-08-20 NOTE — Progress Notes (Signed)
Dr Richardson Doppole discussing risks and benefits of c/s.

## 2013-08-20 NOTE — Anesthesia Procedure Notes (Signed)
Epidural Patient location during procedure: OB Start time: 08/20/2013 11:41 AM  Staffing Performed by: anesthesiologist   Preanesthetic Checklist Completed: patient identified, site marked, surgical consent, pre-op evaluation, timeout performed, IV checked, risks and benefits discussed and monitors and equipment checked  Epidural Patient position: sitting Prep: site prepped and draped and DuraPrep Patient monitoring: continuous pulse ox and blood pressure Approach: midline Injection technique: LOR air  Needle:  Needle type: Tuohy  Needle gauge: 17 G Needle length: 9 cm and 9 Needle insertion depth: 6 cm Catheter type: closed end flexible Catheter size: 19 Gauge Catheter at skin depth: 11 cm Test dose: negative  Assessment Events: blood not aspirated, injection not painful, no injection resistance, negative IV test and no paresthesia  Additional Notes Discussed risk of headache, infection, bleeding, nerve injury and failed or incomplete block.  Patient voices understanding and wishes to proceed.  Epidural placed easily on first attempt.  No paresthesia.  Patient tolerated procedure well with no apparent complications.  Jasmine DecemberA. Cassidy, MDReason for block:procedure for pain

## 2013-08-20 NOTE — Progress Notes (Signed)
Melinda [name] is a 33 y.o. G1P1000 at 5564w3d by LMP admitted for induction of labor due to gestational hypetension .  Subjective:  pt comfortabel   Objective: BP 144/55  Pulse 77  Temp(Src) 98.7 F (37.1 C) (Oral)  Resp 20  Ht 5\' 7"  (1.702 m)  Wt 105.688 kg (233 lb)  BMI 36.48 kg/m2  LMP 11/24/2012  Breastfeeding? Unknown I/O last 3 completed shifts: In: -  Out: 350 [Urine:350] Total I/O In: 1000 [I.V.:1000] Out: 800 [Urine:300; Blood:500]  FHT:  FHR: 175 bpm, variability: minimal ,  accelerations:  Abscent,  decelerations:  Absent UC:   regular, every 2 minutes SVE:   Dilation: 6 Effacement (%): 100 Station: -1 Exam by:: cole md  Labs: Lab Results  Component Value Date   WBC 14.7* 08/20/2013   HGB 11.9* 08/20/2013   HCT 34.4* 08/20/2013   MCV 94.5 08/20/2013   PLT 149* 08/20/2013    Assessment / Plan: 38 wks and 3 days with chorioamnionitis/ gestational hypertension and fetal intolerance of labor .... recommend cesarean section.. r/b/a/ reviewed with the patient and she desires to proceed.   COLE,TARA J. 08/20/2013, 11:03 PM

## 2013-08-20 NOTE — Anesthesia Preprocedure Evaluation (Signed)
Anesthesia Evaluation  Patient identified by MRN, date of birth, ID band Patient awake    Reviewed: Allergy & Precautions, H&P , NPO status , Patient's Chart, lab work & pertinent test results, reviewed documented beta blocker date and time   History of Anesthesia Complications Negative for: history of anesthetic complications  Airway Mallampati: II TM Distance: >3 FB Neck ROM: full    Dental  (+) Teeth Intact   Pulmonary neg pulmonary ROS,  breath sounds clear to auscultation        Cardiovascular hypertension (GHTN), Rhythm:regular Rate:Normal     Neuro/Psych negative neurological ROS  negative psych ROS   GI/Hepatic negative GI ROS, Neg liver ROS,   Endo/Other  BMI 36.6  Renal/GU negative Renal ROS     Musculoskeletal   Abdominal   Peds  Hematology negative hematology ROS (+)   Anesthesia Other Findings   Reproductive/Obstetrics (+) Pregnancy                           Anesthesia Physical Anesthesia Plan  ASA: III  Anesthesia Plan: Epidural   Post-op Pain Management:    Induction:   Airway Management Planned:   Additional Equipment:   Intra-op Plan:   Post-operative Plan:   Informed Consent: I have reviewed the patients History and Physical, chart, labs and discussed the procedure including the risks, benefits and alternatives for the proposed anesthesia with the patient or authorized representative who has indicated his/her understanding and acceptance.     Plan Discussed with:   Anesthesia Plan Comments:         Anesthesia Quick Evaluation

## 2013-08-20 NOTE — Op Note (Addendum)
Cesarean Section Procedure Note  Indications: non-reassuring fetal status  Pre-operative Diagnosis: 38 week 3 day pregnancy.  Post-operative Diagnosis: same  Surgeon: Jessee AversOLE,TARA J.   Assistants: Nigel BridgemanVicki Latham CNM  Anesthesia: Epidural anesthesia  ASA Class: 2   Indication 38 wks and 3 days with gestational hypertension / chorioamnionitis with fetal intolerance of labor. Maximum cervical dilation was 6 cm.   Procedure Details   The patient was seen in the Holding Room. The risks, benefits, complications, treatment options, and expected outcomes were discussed with the patient.  The patient concurred with the proposed plan, giving informed consent.  The site of surgery properly noted/marked. The patient was taken to Operating Room # 9, identified as Melinda [name] and the procedure verified as C-Section Delivery. A Time Out was held and the above information confirmed.  After induction of anesthesia, the patient was draped and prepped in the usual sterile manner. A Pfannenstiel incision was made and carried down through the subcutaneous tissue to the fascia. Fascial incision was made and extended transversely. The fascia was separated from the underlying rectus tissue superiorly and inferiorly. The peritoneum was identified and entered. Peritoneal incision was extended longitudinally. The utero-vesical peritoneal reflection was incised transversely and the bladder flap was bluntly freed from the lower uterine segment. A low transverse uterine incision was made. Delivered from cephalic presentation was a  Female with Apgar scores of 9 at one minute and 9 at five minutes. After the umbilical cord was clamped and cut cord blood was obtained for evaluation. The placenta was removed intact and appeared normal. The uterine outline, tubes and ovaries appeared normal. The uterine incision was closed with running locked sutures of 0 vicryl .Marland Kitchen. a second layer of 0 vicryl was used to imbricate the incision. .  Hemostasis was observed. Lavage was carried out until clear.  Interseed was placed along the uterine incision. The fascia was then reapproximated with running sutures of 0 pds  The skin was reapproximated with 4-0 vicryl.  Instrument, sponge, and needle counts were correct prior the abdominal closure and at the conclusion of the case.   Findings: Female infant in the cephalic presentation.. Normal fallopian tubes and ovaries.   Pt had slight atony once delivered. Hemabate 1 amp was given IM.   Estimated Blood Loss:  400 mL         Drains: foley         Total IV Fluids:  Per anesthesia ml         Specimens: Placenta and Disposition:  Sent to Pathology          Implants: None         Complications:  None; patient tolerated the procedure well.         Disposition: PACU - hemodynamically stable.         Condition: stable  Attending Attestation: I performed the procedure.

## 2013-08-20 NOTE — Progress Notes (Signed)
C/s called at this time.

## 2013-08-20 NOTE — Progress Notes (Signed)
Patient may shower off the monitor.

## 2013-08-20 NOTE — Progress Notes (Signed)
Melinda [name] is a 33 y.o. G1P0 at 7748w3d by LMP admitted for induction of labor due to gestational hypertension.  Subjective:  Patient is comfortable with her epidural.   Objective: BP 123/51  Pulse 78  Temp(Src) 98.5 F (36.9 C) (Oral)  Resp 18  Ht 5\' 7"  (1.702 m)  Wt 105.688 kg (233 lb)  BMI 36.48 kg/m2  LMP 11/24/2012 I/O last 3 completed shifts: In: -  Out: 350 [Urine:350]    FHT:  FHR: 160 bpm, variability: minimal ,  accelerations:  Abscent,  decelerations:  Absent UC:   regular, every 2 minutes SVE:   Dilation: 6 Effacement (%): 100 Station: -1 Exam by:: cole MD  Labs: Lab Results  Component Value Date   WBC 14.7* 08/20/2013   HGB 11.9* 08/20/2013   HCT 34.4* 08/20/2013   MCV 94.5 08/20/2013   PLT 149* 08/20/2013    Assessment / Plan: 38 wks 2 days with gestational hypertension. Chorioamnionitis   Labor:  slow progress on pitocin  Preeclampsia:  NA Fetal Wellbeing:  Category II Pain Control:  Epidural I/D:  unasyn  Anticipated MOD:  if heart rate remains category II  recommend cesarean section... I discussed with the patient r/b/a of cesarean section including but not limited to infection/ bleeding/ damage to bowel bladder baby with the need for further surgery. r/o transfusion HIV/ hep B&C discussed. pt voiced understanding. .. will monitor closely   COLE,TARA J. 08/20/2013, 7:49 PM

## 2013-08-20 NOTE — Progress Notes (Signed)
Dr Richardson Doppole notified of patient progress, SVE, time of last exam, MVUs, UC freq, amt of pit, and FHR tracing.  No orders given at this time.

## 2013-08-20 NOTE — Progress Notes (Signed)
Melinda NestJessica [name] is a 33 y.o. G1P0 at 5739w3d by LMP admitted for induction of labor due to gestational hypertension.  Subjective: Pt is comfortable with her epidural   Objective: BP 118/89  Pulse 87  Temp(Src) 100.5 F (38.1 C) (Axillary)  Resp 16  Ht 5\' 7"  (1.702 m)  Wt 105.688 kg (233 lb)  BMI 36.48 kg/m2  LMP 11/24/2012      FHT:  FHR: 160 bpm, variability: moderate,  accelerations:  Present,  decelerations:  Absent UC:   regular, every 2 minutes SVE:   5.5/90/-1  Labs: Lab Results  Component Value Date   WBC 14.7* 08/20/2013   HGB 11.9* 08/20/2013   HCT 34.4* 08/20/2013   MCV 94.5 08/20/2013   PLT 149* 08/20/2013    Assessment / Plan: Induction of labor due to gestational hypertension,  progressing well on pitocin with chorioamnionitis   Labor: progressing on pitocin Preeclampsia:  NA Fetal Wellbeing:  Category I Pain Control:  Epidural I/D:  unasyn and tylenol Anticipated MOD:  NSVD  COLE,TARA J. 08/20/2013, 5:50 PM

## 2013-08-21 ENCOUNTER — Encounter (HOSPITAL_COMMUNITY): Payer: Self-pay

## 2013-08-21 DIAGNOSIS — Z98891 History of uterine scar from previous surgery: Secondary | ICD-10-CM

## 2013-08-21 DIAGNOSIS — O411290 Chorioamnionitis, unspecified trimester, not applicable or unspecified: Secondary | ICD-10-CM | POA: Diagnosis not present

## 2013-08-21 LAB — CBC
HEMATOCRIT: 31 % — AB (ref 36.0–46.0)
Hemoglobin: 10.8 g/dL — ABNORMAL LOW (ref 12.0–15.0)
MCH: 32.5 pg (ref 26.0–34.0)
MCHC: 34.8 g/dL (ref 30.0–36.0)
MCV: 93.4 fL (ref 78.0–100.0)
PLATELETS: 140 10*3/uL — AB (ref 150–400)
RBC: 3.32 MIL/uL — AB (ref 3.87–5.11)
RDW: 13.6 % (ref 11.5–15.5)
WBC: 26.1 10*3/uL — ABNORMAL HIGH (ref 4.0–10.5)

## 2013-08-21 MED ORDER — ONDANSETRON HCL 4 MG/2ML IJ SOLN: 4.0000 mg | Freq: Three times a day (TID) | INTRAMUSCULAR | Status: DC | PRN

## 2013-08-21 MED ORDER — SIMETHICONE 80 MG PO CHEW: 80.0000 mg | CHEWABLE_TABLET | ORAL | Status: DC | PRN

## 2013-08-21 MED ORDER — DIPHENHYDRAMINE HCL 50 MG/ML IJ SOLN: 12.5000 mg | INTRAMUSCULAR | Status: DC | PRN

## 2013-08-21 MED ORDER — WITCH HAZEL-GLYCERIN EX PADS: 1.0000 "application " | MEDICATED_PAD | CUTANEOUS | Status: DC | PRN

## 2013-08-21 MED ORDER — NALBUPHINE HCL 10 MG/ML IJ SOLN: 5.0000 mg | INTRAMUSCULAR | Status: DC | PRN

## 2013-08-21 MED ORDER — SODIUM CHLORIDE 0.9 % IJ SOLN: 3.0000 mL | INTRAMUSCULAR | Status: DC | PRN

## 2013-08-21 MED ORDER — ZOLPIDEM TARTRATE 5 MG PO TABS: 5.0000 mg | ORAL_TABLET | Freq: Every evening | ORAL | Status: DC | PRN

## 2013-08-21 MED ORDER — SIMETHICONE 80 MG PO CHEW
80.0000 mg | CHEWABLE_TABLET | ORAL | Status: DC
  Administered 2013-08-21 – 2013-08-23 (×2): 80 mg via ORAL
  Filled 2013-08-21: qty 1

## 2013-08-21 MED ORDER — MENTHOL 3 MG MT LOZG: 1.0000 | LOZENGE | OROMUCOSAL | Status: DC | PRN

## 2013-08-21 MED ORDER — PRENATAL MULTIVITAMIN CH
1.0000 | ORAL_TABLET | Freq: Every day | ORAL | Status: DC
  Administered 2013-08-21 – 2013-08-23 (×3): 1 via ORAL
  Filled 2013-08-21 (×3): qty 1

## 2013-08-21 MED ORDER — CLINDAMYCIN PHOSPHATE 900 MG/50ML IV SOLN
900.0000 mg | Freq: Three times a day (TID) | INTRAVENOUS | Status: AC
  Administered 2013-08-21 – 2013-08-22 (×2): 900 mg via INTRAVENOUS
  Filled 2013-08-21 (×3): qty 50

## 2013-08-21 MED ORDER — ONDANSETRON HCL 4 MG PO TABS: 4.0000 mg | ORAL_TABLET | ORAL | Status: DC | PRN

## 2013-08-21 MED ORDER — SIMETHICONE 80 MG PO CHEW
80.0000 mg | CHEWABLE_TABLET | Freq: Three times a day (TID) | ORAL | Status: DC
  Administered 2013-08-21 – 2013-08-23 (×7): 80 mg via ORAL
  Filled 2013-08-21 (×8): qty 1

## 2013-08-21 MED ORDER — OXYCODONE-ACETAMINOPHEN 5-325 MG PO TABS
1.0000 | ORAL_TABLET | ORAL | Status: DC | PRN
  Administered 2013-08-21 – 2013-08-23 (×6): 1 via ORAL
  Filled 2013-08-21 (×6): qty 1

## 2013-08-21 MED ORDER — FENTANYL CITRATE 0.05 MG/ML IJ SOLN
INTRAMUSCULAR | Status: AC
  Administered 2013-08-21: 50 ug via INTRAVENOUS
  Filled 2013-08-21: qty 2

## 2013-08-21 MED ORDER — FERROUS SULFATE 325 (65 FE) MG PO TABS
325.0000 mg | ORAL_TABLET | Freq: Two times a day (BID) | ORAL | Status: DC
  Administered 2013-08-21 – 2013-08-23 (×6): 325 mg via ORAL
  Filled 2013-08-21 (×6): qty 1

## 2013-08-21 MED ORDER — NALOXONE HCL 0.4 MG/ML IJ SOLN: 0.4000 mg | INTRAMUSCULAR | Status: DC | PRN

## 2013-08-21 MED ORDER — KETOROLAC TROMETHAMINE 30 MG/ML IJ SOLN
INTRAMUSCULAR | Status: AC
  Administered 2013-08-21: 30 mg via INTRAMUSCULAR
  Filled 2013-08-21: qty 1

## 2013-08-21 MED ORDER — DIBUCAINE 1 % RE OINT
1.0000 "application " | TOPICAL_OINTMENT | RECTAL | Status: DC | PRN
  Filled 2013-08-21: qty 28

## 2013-08-21 MED ORDER — OXYTOCIN 40 UNITS IN LACTATED RINGERS INFUSION - SIMPLE MED: 62.5000 mL/h | INTRAVENOUS | Status: AC

## 2013-08-21 MED ORDER — PRENATAL MULTIVITAMIN CH: 1.0000 | ORAL_TABLET | Freq: Every day | ORAL | Status: DC

## 2013-08-21 MED ORDER — DIPHENHYDRAMINE HCL 25 MG PO CAPS: 25.0000 mg | ORAL_CAPSULE | Freq: Four times a day (QID) | ORAL | Status: DC | PRN

## 2013-08-21 MED ORDER — SODIUM CHLORIDE 0.9 % IV SOLN
3.0000 g | Freq: Four times a day (QID) | INTRAVENOUS | Status: AC
  Administered 2013-08-21 (×4): 3 g via INTRAVENOUS
  Filled 2013-08-21 (×4): qty 3

## 2013-08-21 MED ORDER — DIPHENHYDRAMINE HCL 50 MG/ML IJ SOLN: 25.0000 mg | INTRAMUSCULAR | Status: DC | PRN

## 2013-08-21 MED ORDER — LOPERAMIDE HCL 2 MG PO CAPS
4.0000 mg | ORAL_CAPSULE | ORAL | Status: DC | PRN
  Filled 2013-08-21: qty 2

## 2013-08-21 MED ORDER — LACTATED RINGERS IV SOLN
INTRAVENOUS | Status: DC
  Administered 2013-08-21: 10:00:00 via INTRAVENOUS

## 2013-08-21 MED ORDER — IBUPROFEN 600 MG PO TABS
600.0000 mg | ORAL_TABLET | Freq: Four times a day (QID) | ORAL | Status: DC
  Administered 2013-08-21 – 2013-08-23 (×9): 600 mg via ORAL
  Filled 2013-08-21 (×11): qty 1

## 2013-08-21 MED ORDER — DIPHENHYDRAMINE HCL 25 MG PO CAPS: 25.0000 mg | ORAL_CAPSULE | ORAL | Status: DC | PRN

## 2013-08-21 MED ORDER — NALOXONE HCL 1 MG/ML IJ SOLN
1.0000 ug/kg/h | INTRAMUSCULAR | Status: DC | PRN
Start: 2013-08-21 — End: 2013-08-23
  Filled 2013-08-21: qty 2

## 2013-08-21 MED ORDER — METOCLOPRAMIDE HCL 5 MG/ML IJ SOLN: 10.0000 mg | Freq: Three times a day (TID) | INTRAMUSCULAR | Status: DC | PRN

## 2013-08-21 MED ORDER — SENNOSIDES-DOCUSATE SODIUM 8.6-50 MG PO TABS
2.0000 | ORAL_TABLET | ORAL | Status: DC
  Administered 2013-08-21 – 2013-08-23 (×2): 2 via ORAL
  Filled 2013-08-21 (×2): qty 2

## 2013-08-21 MED ORDER — ONDANSETRON HCL 4 MG/2ML IJ SOLN: 4.0000 mg | INTRAMUSCULAR | Status: DC | PRN

## 2013-08-21 MED ORDER — LANOLIN HYDROUS EX OINT: 1.0000 "application " | TOPICAL_OINTMENT | CUTANEOUS | Status: DC | PRN

## 2013-08-21 NOTE — Lactation Note (Signed)
This note was copied from the chart of Melinda [name]. Lactation Consultation Note  Initial consult:  Mother has breast implants.  Has been taught hand expression.  Drops expressed. Deb RN assisted mother recently with breastfeeding.  Baby starting to wake to eat.   Reviewed basics with parents and cluster feeding. Mom encouraged to feed baby 8-12 times/24 hours and with feeding cues.  Mom made aware of O/P services, breastfeeding support groups, community resources, and our phone # for post-discharge questions.  Discussed the probability of starting to post pump with DEBP tomorrow.  Parents responsive. FOB expressed that he does not want baby to have a pacifier or bottle until 496 weeks old.   Patient Name: Melinda Doreene NestJessica Delpriore ZOXWR'UToday's Date: 08/21/2013 Reason for consult: Initial assessment   Maternal Data Has patient been taught Hand Expression?: Yes  Feeding Feeding Type: Breast Fed Length of feed: 10 min  LATCH Score/Interventions                      Lactation Tools Discussed/Used     Consult Status Consult Status: Follow-up Date: 08/22/13 Follow-up type: In-patient    [name], Ruth [facility] 08/21/2013, 11:07 PM

## 2013-08-21 NOTE — Anesthesia Postprocedure Evaluation (Signed)
Anesthesia Post Note  Patient: Melinda [name]  Procedure(s) Performed: Procedure(s) (LRB): CESAREAN SECTION (N/A)  Anesthesia type: Epidural  Patient location: Mother/Baby  Post pain: Pain level controlled  Post assessment: Post-op Vital signs reviewed  Last Vitals:  Filed Vitals:   08/21/13 0617  BP: 137/82  Pulse: 88  Temp:   Resp:     Post vital signs: Reviewed  Level of consciousness:alert  Complications: No apparent anesthesia complications

## 2013-08-21 NOTE — Addendum Note (Signed)
Addendum created 08/21/13 [id number] by Graciela HusbandsWynn O Fussell, CRNA   Modules edited: Charges VN, Notes Section   Notes Section:  File: [id number]

## 2013-08-21 NOTE — Anesthesia Postprocedure Evaluation (Signed)
  Anesthesia Post-op Note  Patient: Melinda [name]  Procedure(s) Performed: Procedure(s): CESAREAN SECTION (N/A)  Patient Location: PACU  Anesthesia Type:Epidural  Level of Consciousness: awake and alert   Airway and Oxygen Therapy: Patient Spontanous Breathing  Post-op Pain: mild  Post-op Assessment: Post-op Vital signs reviewed, Patient's Cardiovascular Status Stable, Respiratory Function Stable, Patent Airway, No signs of Nausea or vomiting and Pain level controlled  Post-op Vital Signs: Reviewed and stable  Last Vitals:  Filed Vitals:   08/21/13 0239  BP: 129/65  Pulse: 65  Temp: 37.2 C  Resp: 16    Complications: No apparent anesthesia complications

## 2013-08-21 NOTE — Progress Notes (Addendum)
Subjective: Postpartum Day 1: Cesarean Delivery Patient reports tolerating PO.  Pain is well controlled.   Objective: Vital signs in last 24 hours: Temp:  [97.8 F (36.6 C)-100.9 F (38.3 C)] 97.9 F (36.6 C) (06/25 1430) Pulse Rate:  [60-149] 71 (06/25 1430) Resp:  [16-26] 18 (06/25 1430) BP: (98-161)/(50-118) 108/71 mmHg (06/25 1430) SpO2:  [93 %-99 %] 97 % (06/25 1430)  Physical Exam:  General: alert and cooperative Lochia: appropriate Uterine Fundus: firm Incision: bandage clean dry intact  DVT Evaluation: No evidence of DVT seen on physical exam.   Recent Labs  08/20/13 1048 08/21/13 0700  HGB 11.9* 10.8*  HCT 34.4* 31.0*    Assessment/Plan: Status post Cesarean section. Doing well postoperatively.  Chorioamnionitis.. Continue unasyn/ clindamycin until 24 hours post delivery.. Gestational hypertension.Marland Kitchen. bp stable... Plan bp check in the office in 1 wk  Dr. Dion BodyVarnado covering 08/22/2013.  COLE,TARA J. 08/21/2013, 5:04 PM

## 2013-08-22 LAB — CBC
HEMATOCRIT: 27.4 % — AB (ref 36.0–46.0)
Hemoglobin: 9.2 g/dL — ABNORMAL LOW (ref 12.0–15.0)
MCH: 31.9 pg (ref 26.0–34.0)
MCHC: 33.6 g/dL (ref 30.0–36.0)
MCV: 95.1 fL (ref 78.0–100.0)
Platelets: 139 10*3/uL — ABNORMAL LOW (ref 150–400)
RBC: 2.88 MIL/uL — AB (ref 3.87–5.11)
RDW: 14 % (ref 11.5–15.5)
WBC: 18 10*3/uL — ABNORMAL HIGH (ref 4.0–10.5)

## 2013-08-22 NOTE — Progress Notes (Signed)
Subjective: Postop Day 2: Cesarean Delivery No complaints.  Pain controlled.  Lochia normal.  Breast feeding yes.  Objective: Temp:  [97.8 F (36.6 C)-98.4 F (36.9 C)] 97.9 F (36.6 C) (06/26 0607) Pulse Rate:  [62-91] 91 (06/26 0607) Resp:  [18-20] 18 (06/26 0607) BP: (108-134)/(65-75) 119/71 mmHg (06/26 0607) SpO2:  [97 %] 97 % (06/25 1430)  Physical Exam: Gen: NAD, comfortable Lochia: Not visualized Uterine Fundus: firm, appropriately tender Abdomen:  Distended, Tympanic Incision: dry and intact, healing well, moderate soiling on central portion of honeycomb dressing DVT Evaluation:  Edema present, no calf tenderness bilaterally    Recent Labs  08/21/13 0700 08/22/13 0535  HGB 10.8* 9.2*  HCT 31.0* 27.4*    Assessment/Plan: Status post C-section-doing well postoperatively. GHTN- BP normal Chorioamnionitis, s/p antibiotics x 24.  WBC trending down. Routine post op care. Distention due to gas.  Encouraged ambulation TID. [facility] covering the weekend.    VARNADO, EVELYN 08/22/2013, 8:04 AM

## 2013-08-22 NOTE — Lactation Note (Signed)
This note was copied from the chart of Melinda Doreene NestJessica Cannaday. Lactation Consultation Note F/U d/t 9% wt. Loss. Hx;Breast augmentation, baby poor feeder yesterday, noted mom nipples bruised, slight tenderness noted. Mom stated baby cluster feeding for short intervals frequently.  DEBP iniated to pre-pump to bring colostrum to end of nipple up to 5 minutes. BF baby, then post-pump for 10 min. And any colostrum give to baby in curve tip syring. May have to consider supplementing if not post-pumping colostrum d/t wt. Loss. Strick I&O encouraged for feeding log. Comfort gels given to wear after feedings, as well as shells since bruised and to help obtain deeper latch. Instructed FOB at bedside chin tug and positioning.  Mom denies pain at this time for this feeding w/correct deep latch. Patient Name: Melinda [name] QMVHQ'IToday's Date: 08/22/2013 Reason for consult: Follow-up assessment;Infant weight loss   Maternal Data    Feeding Feeding Type: Breast Fed Length of feed: 15 min  LATCH Score/Interventions Latch: Grasps breast easily, tongue down, lips flanged, rhythmical sucking. Intervention(s): Skin to skin;Waking techniques;Teach feeding cues Intervention(s): Adjust position;Assist with latch;Breast massage;Breast compression  Audible Swallowing: A few with stimulation Intervention(s): Skin to skin;Hand expression Intervention(s): Skin to skin;Hand expression;Alternate breast massage  Type of Nipple: Everted at rest and after stimulation  Comfort (Breast/Nipple): Filling, red/small blisters or bruises, mild/mod discomfort  Problem noted: Mild/Moderate discomfort;Cracked, bleeding, blisters, bruises Interventions  (Cracked/bleeding/bruising/blister): Expressed breast milk to nipple;Double electric pump Interventions (Mild/moderate discomfort): Comfort gels;Post-pump;Pre-pump if needed;Hand massage;Hand expression  Hold (Positioning): Assistance needed to correctly position infant at breast  and maintain latch. Intervention(s): Breastfeeding basics reviewed;Support Pillows;Position options;Skin to skin  LATCH Score: 7  Lactation Tools Discussed/Used Tools: Shells;Pump;Comfort gels Shell Type: Inverted Breast pump type: Double-Electric Breast Pump Pump Review: Setup, frequency, and cleaning Initiated by:: Peri JeffersonL. Carver RN Date initiated:: 08/22/13   Consult Status Consult Status: Follow-up Date: 08/22/13 Follow-up type: In-patient    Charyl DancerCARVER, LAURA G 08/22/2013, 7:38 AM

## 2013-08-22 NOTE — Lactation Note (Addendum)
This note was copied from the chart of Melinda [name]. Lactation Consultation Note  Mother with breast implants, 9% weight loss. Mother placed baby in fb hold.  Sucks and swallows observed. LS7. Baby breastfed on both breasts. Plan is for mother to post pump for 15 min. Both breasts and give baby milk that is pumped. Reviewed breast massage and cluster feeding. Mother's nipple are sore.  Abrasions on both breasts.  Baby tends to break suction easily. Encouarged compressing while feeding.   Patient Name: Melinda Doreene NestJessica Bouvier ZOXWR'UToday's Date: 08/22/2013 Reason for consult: Follow-up assessment   Maternal Data    Feeding Feeding Type: Breast Fed Length of feed: 30 min  LATCH Score/Interventions Latch: Grasps breast easily, tongue down, lips flanged, rhythmical sucking. Intervention(s): Skin to skin Intervention(s): Breast massage;Breast compression;Assist with latch  Audible Swallowing: A few with stimulation Intervention(s): Alternate breast massage  Type of Nipple: Everted at rest and after stimulation  Comfort (Breast/Nipple): Filling, red/small blisters or bruises, mild/mod discomfort  Problem noted: Filling;Mild/Moderate discomfort Interventions  (Cracked/bleeding/bruising/blister): Double electric pump Interventions (Mild/moderate discomfort): Comfort gels;Hand expression;Pre-pump if needed;Post-pump  Hold (Positioning): Assistance needed to correctly position infant at breast and maintain latch. Intervention(s): Support Pillows  LATCH Score: 7  Lactation Tools Discussed/Used     Consult Status Consult Status: Follow-up Date: 08/23/13 Follow-up type: In-patient    [name], Ruth [facility] 08/22/2013, 10:55 AM

## 2013-08-22 NOTE — Lactation Note (Signed)
This note was copied from the chart of Melinda Doreene NestJessica Bickford. Lactation Consultation Note  Reviewed feeding plan with parents. Mother should hand express then bf for more than 10 min.  Wake baby up for feedings. Post pump for 15 min both breasts  After daytime feedings and give baby back volume pumped with syringe. Monitor voids/stools.  Parents may want to rent $40 pump tomorrow. Encouarged parents to call if further assistance is needed.   Patient Name: Melinda [name] ZOXWR'UToday's Date: 08/22/2013     Maternal Data    Feeding Feeding Type: Breast Fed Length of feed: 20 min  LATCH Score/Interventions                      Lactation Tools Discussed/Used     Consult Status Consult Status: Follow-up Date: 08/23/13 Follow-up type: In-patient    [name], [name] 08/22/2013, 4:05 PM

## 2013-08-23 ENCOUNTER — Encounter (HOSPITAL_COMMUNITY): Payer: Self-pay | Admitting: Obstetrics and Gynecology

## 2013-08-23 MED ORDER — OXYCODONE-ACETAMINOPHEN 5-325 MG PO TABS: 1.0000 | ORAL_TABLET | ORAL | Status: DC | PRN

## 2013-08-23 MED ORDER — DOCUSATE SODIUM 100 MG PO CAPS: 100.0000 mg | ORAL_CAPSULE | Freq: Two times a day (BID) | ORAL | Status: DC

## 2013-08-23 MED ORDER — FERROUS SULFATE 325 (65 FE) MG PO TABS: 325.0000 mg | ORAL_TABLET | Freq: Two times a day (BID) | ORAL | Status: DC

## 2013-08-23 MED ORDER — IBUPROFEN 800 MG PO TABS: 800.0000 mg | ORAL_TABLET | Freq: Three times a day (TID) | ORAL | Status: DC | PRN

## 2013-08-23 NOTE — Discharge Instructions (Signed)
Iron-Rich Diet  An iron-rich diet contains foods that are good sources of iron. Iron is an important mineral that helps your body produce hemoglobin. Hemoglobin is a protein in red blood cells that carries oxygen to the body's tissues. Sometimes, the iron level in your blood can be low. This may be caused by:  A lack of iron in your diet.  Blood loss.  Times of growth, such as during pregnancy or during a child's growth and development. Low levels of iron can cause a decrease in the number of red blood cells. This can result in iron deficiency anemia. Iron deficiency anemia symptoms include:  Tiredness.  Weakness.  Irritability.  Increased chance of infection. Here are some recommendations for daily iron intake:  Males older than 31 years of age need 8 mg of iron per day.  Women ages 71 to 35 need 18 mg of iron per day.  Pregnant women need 27 mg of iron per day, and women who are over 37 years of age and breastfeeding need 9 mg of iron per day.  Women over the age of 10 need 8 mg of iron per day. SOURCES OF IRON There are 2 types of iron that are found in food: heme iron and nonheme iron. Heme iron is absorbed by the body better than nonheme iron. Heme iron is found in meat, poultry, and fish. Nonheme iron is found in grains, beans, and vegetables. Heme Iron Sources Food / Iron (mg)  Chicken liver, 3 oz (85 g)/ 10 mg  Beef liver, 3 oz (85 g)/ 5.5 mg  Oysters, 3 oz (85 g)/ 8 mg  Beef, 3 oz (85 g)/ 2 to 3 mg  Shrimp, 3 oz (85 g)/ 2.8 mg  [place], 3 oz (85 g)/ 2 mg  Chicken, 3 oz (85 g) / 1 mg  Fish (tuna, halibut), 3 oz (85 g)/ 1 mg  Pork, 3 oz (85 g)/ 0.9 mg Nonheme Iron Sources Food / Iron (mg)  Ready-to-eat breakfast cereal, iron-fortified / 3.9 to 7 mg  Tofu,  cup / 3.4 mg  Kidney beans,  cup / 2.6 mg  Baked potato with skin / 2.7 mg  Asparagus,  cup / 2.2 mg  Avocado / 2 mg  Dried peaches,  cup / 1.6 mg  Raisins,  cup / 1.5 mg  Soy milk, 1  cup / 1.5 mg  Whole-wheat bread, 1 slice / 1.2 mg  Spinach, 1 cup / 0.8 mg  Broccoli,  cup / 0.6 mg IRON ABSORPTION Certain foods can decrease the body's absorption of iron. Try to avoid these foods and beverages while eating meals with iron-containing foods:  Coffee.  Tea.  Fiber.  Soy. Foods containing vitamin C can help increase the amount of iron your body absorbs from iron sources, especially from nonheme sources. Eat foods with vitamin C along with iron-containing foods to increase your iron absorption. Foods that are high in vitamin C include many fruits and vegetables. Some good sources are:  Fresh orange juice.  Oranges.  Strawberries.  Mangoes.  Grapefruit.  Red bell peppers.  Green bell peppers.  Broccoli.  Potatoes with skin.  Tomato juice. Document Released: 09/27/2004 Document Revised: 05/08/2011 Document Reviewed: 08/04/2010 [facility] Patient Information 2015 [place], [place]. This information is not intended to replace advice given to you by your health care provider. Make sure you discuss any questions you have with your health care provider. Postpartum Care After Cesarean Delivery After you deliver your newborn (postpartum period), the usual stay  in the hospital is 24-72 hours. If there were problems with your labor or delivery, or if you have other medical problems, you might be in the hospital longer.  While you are in the hospital, you will receive help and instructions on how to care for yourself and your newborn during the postpartum period.  While you are in the hospital:  It is normal for you to have pain or discomfort from the incision in your abdomen. Be sure to tell your nurses when you are having pain, where the pain is located, and what makes the pain worse.  If you are breastfeeding, you may feel uncomfortable contractions of your uterus for a couple of weeks. This is normal. The contractions help your uterus get back to normal size.  It  is normal to have some bleeding after delivery.  For the first 1-3 days after delivery, the flow is red and the amount may be similar to a period.  It is common for the flow to start and stop.  In the first few days, you may pass some small clots. Let your nurses know if you begin to pass large clots or your flow increases.  Do not  flush blood clots down the toilet before having the nurse look at them.  During the next 3-10 days after delivery, your flow should become more watery and pink or brown-tinged in color.  Ten to fourteen days after delivery, your flow should be a small amount of yellowish-white discharge.  The amount of your flow will decrease over the first few weeks after delivery. Your flow may stop in 6-8 weeks. Most women have had their flow stop by 12 weeks after delivery.  You should change your sanitary pads frequently.  Wash your hands thoroughly with soap and water for at least 20 seconds after changing pads, using the toilet, or before holding or feeding your newborn.  Your intravenous (IV) tubing will be removed when you are drinking enough fluids.  The urine drainage tube (urinary catheter) that was inserted before delivery may be removed within 6-8 hours after delivery or when feeling returns to your legs. You should feel like you need to empty your bladder within the first 6-8 hours after the catheter has been removed.  In case you become weak, lightheaded, or faint, call your nurse before you get out of bed for the first time and before you take a shower for the first time.  Within the first few days after delivery, your breasts may begin to feel tender and full. This is called engorgement. Breast tenderness usually goes away within 48-72 hours after engorgement occurs. You may also notice milk leaking from your breasts. If you are not breastfeeding, do not stimulate your breasts. Breast stimulation can make your breasts produce more milk.  Spending as much time  as possible with your newborn is very important. During this time, you and your newborn can feel close and get to know each other. Having your newborn stay in your room (rooming in) will help to strengthen the bond with your newborn. It will give you time to get to know your newborn and become comfortable caring for your newborn.  Your hormones change after delivery. Sometimes the hormone changes can temporarily cause you to feel sad or tearful. These feelings should not last more than a few days. If these feelings last longer than that, you should talk to your caregiver.  If desired, talk to your caregiver about methods of family planning or  contraception.  Talk to your caregiver about immunizations. Your caregiver may want you to have the following immunizations before leaving the hospital:  Tetanus, diphtheria, and pertussis (Tdap) or tetanus and diphtheria (Td) immunization. It is very important that you and your family (including grandparents) or others caring for your newborn are up-to-date with the Tdap or Td immunizations. The Tdap or Td immunization can help protect your newborn from getting ill.  Rubella immunization.  Varicella (chickenpox) immunization.  Influenza immunization. You should receive this annual immunization if you did not receive the immunization during your pregnancy. Document Released: 11/08/2011 Document Reviewed: 11/08/2011 [facility] Patient Information 2015 [place], [place]. This information is not intended to replace advice given to you by your health care provider. Make sure you discuss any questions you have with your health care provider. Postpartum Depression and Baby Blues The postpartum period begins right after the birth of a baby. During this time, there is often a great amount of joy and excitement. It is also a time of many changes in the life of the parents. Regardless of how many times a mother gives birth, each child brings new challenges and dynamics to  the family. It is not unusual to have feelings of excitement along with confusing shifts in moods, emotions, and thoughts. All mothers are at risk of developing postpartum depression or the "baby blues." These mood changes can occur right after giving birth, or they may occur many months after giving birth. The baby blues or postpartum depression can be mild or severe. Additionally, postpartum depression can go away rather quickly, or it can be a long-term condition.  CAUSES Raised hormone levels and the rapid drop in those levels are thought to be a main cause of postpartum depression and the baby blues. A number of hormones change during and after pregnancy. Estrogen and progesterone usually decrease right after the delivery of your baby. The levels of thyroid hormone and various cortisol steroids also rapidly drop. Other factors that play a role in these mood changes include major life events and genetics.  RISK FACTORS If you have any of the following risks for the baby blues or postpartum depression, know what symptoms to watch out for during the postpartum period. Risk factors that may increase the likelihood of getting the baby blues or postpartum depression include:  Having a personal or family history of depression.   Having depression while being pregnant.   Having premenstrual mood issues or mood issues related to oral contraceptives.  Having a lot of life stress.   Having marital conflict.   Lacking a social support network.   Having a baby with special needs.   Having health problems, such as diabetes.  SIGNS AND SYMPTOMS Symptoms of baby blues include:  Brief changes in mood, such as going from extreme happiness to sadness.  Decreased concentration.   Difficulty sleeping.   Crying spells, tearfulness.   Irritability.   Anxiety.  Symptoms of postpartum depression typically begin within the first month after giving birth. These symptoms  include:  Difficulty sleeping or excessive sleepiness.   Marked weight loss.   Agitation.   Feelings of worthlessness.   Lack of interest in activity or food.  Postpartum psychosis is a very serious condition and can be dangerous. Fortunately, it is rare. Displaying any of the following symptoms is cause for immediate medical attention. Symptoms of postpartum psychosis include:   Hallucinations and delusions.   Bizarre or disorganized behavior.   Confusion or disorientation.  DIAGNOSIS  A  diagnosis is made by an evaluation of your symptoms. There are no medical or lab tests that lead to a diagnosis, but there are various questionnaires that a health care provider may use to identify those with the baby blues, postpartum depression, or psychosis. Often, a screening tool called the [place] Postnatal Depression Scale is used to diagnose depression in the postpartum period.  TREATMENT The baby blues usually goes away on its own in 1-2 weeks. Social support is often all that is needed. You will be encouraged to get adequate sleep and rest. Occasionally, you may be given medicines to help you sleep.  Postpartum depression requires treatment because it can last several months or longer if it is not treated. Treatment may include individual or group therapy, medicine, or both to address any social, physiological, and psychological factors that may play a role in the depression. Regular exercise, a healthy diet, rest, and social support may also be strongly recommended.  Postpartum psychosis is more serious and needs treatment right away. Hospitalization is often needed. HOME CARE INSTRUCTIONS  Get as much rest as you can. Nap when the baby sleeps.   Exercise regularly. Some women find yoga and walking to be beneficial.   Eat a balanced and nourishing diet.   Do little things that you enjoy. Have a cup of tea, take a bubble bath, read your favorite magazine, or listen to your  favorite music.  Avoid alcohol.   Ask for help with household chores, cooking, grocery shopping, or running errands as needed. Do not try to do everything.   Talk to people close to you about how you are feeling. Get support from your partner, family members, friends, or other new moms.  Try to stay positive in how you think. Think about the things you are grateful for.   Do not spend a lot of time alone.   Only take over-the-counter or prescription medicine as directed by your health care provider.  Keep all your postpartum appointments.   Let your health care provider know if you have any concerns.  SEEK MEDICAL CARE IF: You are having a reaction to or problems with your medicine. SEEK IMMEDIATE MEDICAL CARE IF:  You have suicidal feelings.   You think you may harm the baby or someone else. MAKE SURE YOU:  Understand these instructions.  Will watch your condition.  Will get help right away if you are not doing well or get worse. Document Released: 11/18/2003 Document Revised: 02/18/2013 Document Reviewed: 11/25/2012 [facility] Patient Information 2015 [place], [place]. This information is not intended to replace advice given to you by your health care provider. Make sure you discuss any questions you have with your health care provider.

## 2013-08-23 NOTE — Progress Notes (Signed)
Subjective:  Postpartum Day 3: Cesarean Delivery  Patient reports incisional pain and tolerating PO.    Objective: Vital signs in last 24 hours: Temp:  [98.5 F (36.9 C)-98.6 F (37 C)] 98.6 F (37 C) (06/27 69620613) Pulse Rate:  [79-80] 79 (06/27 0613) Resp:  [20] 20 (06/27 0613) BP: (139-147)/(80-85) 139/80 mmHg (06/27 0613) SpO2:  [98 %] 98 % (06/27 95280613)  Physical Exam:  General: no distress Lochia: appropriate Uterine Fundus: firm Incision: Clean dressing DVT Evaluation: No evidence of DVT seen on physical exam.   Recent Labs  08/21/13 0700 08/22/13 0535  HGB 10.8* 9.2*  HCT 31.0* 27.4*    Assessment/Plan: Status post Cesarean section. Doing well postoperatively.  Anemia  Discharge home with standard precautions and return to clinic in 4-6 weeks. Breast feeding. She will continue to work with lactation today. Undecided about contraception.  Janine LimboSTRINGER,ARTHUR V 08/23/2013, 12:47 PM

## 2013-08-24 NOTE — Discharge Summary (Signed)
Cesarean Section Delivery Discharge Summary  Melinda NestJessica Schlauch  DOB:    August 16, 1980 MRN:    [id number] CSN:    [id number]  Date of admission:                  08/19/13  Date of discharge:                   08/23/13  Procedures this admission: IOL for Gestational HTN, Primary c-section for fetal intolerance to labor, Antibiotics for chorio, Hemabate after delivery for uterine atony.  Date of Delivery:  08/20/13  Newborn Data:  Live born female  Birth Weight: 7 lb 9.9 oz (3455 g) APGAR: 9, 9  Home with mother. Name: Melinda [name]   History of Present Illness:  Ms. Melinda [name] is a 33 y.o. female, G1P1000, who presents at 9582w3d weeks gestation. [facility]    Her pregnancy has been complicated by:  Patient Active Problem List   Diagnosis Date Noted  . Status post primary low transverse cesarean section 08/21/2013  . Chorioamnionitis 08/21/2013   Gestational Hypertension  Hospital Course--Unscheduled Cesarean:  The patient was admitted on 08/20/13 for IOL for Gestational HTN. Negative GBS.  Utilized Epidural for pain management.  Due to fetal intolerance to labor, she was consented for cesarean, with Dr. Richardson Doppole performing a primary LTCS under epidural anesthesia, with delivery of a viable female, with weight and Apgars as listed above. Infant was in good condition and remained at the patient's bedside. Chorio was noted and pt was given antibiotics for 24 hours. Due to uterine atony after delivery, one dose of Hemabate was given IM.  The patient was taken to recovery in good condition.  Patient planned to breastfeed.  On post-op day 1, patient was doing well, tolerating a regular diet, with Hgb of 11.9.  Throughout her stay, her physical exam was WNL, her incision was CDI, and her vital signs remained stable and did not require any hypertensive medication.  By post-op day 3, she was up ad lib, tolerating a regular diet, with good pain control with po med.  She  was deemed to have received the full benefit of her hospital stay, and was discharged home in stable condition.  Contraceptive choice was undecided.    Feeding:  breast  Contraception:  Undecided  Discharge hemoglobin:  Hemoglobin  Date Value Ref Range Status  08/22/2013 9.2* 12.0 - 15.0 g/dL Final  3/66/44036/25/2015 47.410.8* 12.0 - 15.0 g/dL Final  2/59/56386/24/2015 75.611.9* 12.0 - 15.0 g/dL Final     HCT  Date Value Ref Range Status  08/22/2013 27.4* 36.0 - 46.0 % Final  08/21/2013 31.0* 36.0 - 46.0 % Final  08/20/2013 34.4* 36.0 - 46.0 % Final     WBC  Date Value Ref Range Status  08/22/2013 18.0* 4.0 - 10.5 K/uL Final  08/21/2013 26.1* 4.0 - 10.5 K/uL Final  08/20/2013 14.7* 4.0 - 10.5 K/uL Final    Discharge Physical Exam:   BP 139/80 on discharge  General: alert Lochia: appropriate Uterine Fundus: firm Abdomen:  + bowel sounds, NTND Incision: no significant drainage DVT Evaluation: No evidence of DVT seen on physical exam. Negative Homan's sign.  Intrapartum Procedures: cesarean: low cervical, transverse Postpartum Procedures: antibiotics and Hemabate Complications-Operative and Postpartum: Chorio  Discharge Diagnoses: Term Pregnancy-delivered and Amnionitis  Discharge Information:  Activity:           pelvic rest Diet:  routine Medications: PNV, Ibuprofen, Colace, Iron, Percocet and Calcium Condition:      stable Instructions:  Discharge to: home  Follow-up Information   Follow up with Jessee AversOLE,TARA J., MD. Schedule an appointment as soon as possible for a visit in 1 week. (blood pressure check )    Specialty:  Obstetrics and Gynecology   Contact information:   [address] [place] [address] [phone]       Follow up with Jessee AversOLE,TARA J., MD In 2 weeks.   Specialty:  Obstetrics and Gynecology   Contact information:   [address] [place] [address] [phone]      Pt d/c'd by Dr. Stefano GaulStringer on  08/23/13.  Sherre ScarletWILLIAMS, KIMBERLY CNM 08/24/2013 6:10 PM

## 2013-08-27 ENCOUNTER — Inpatient Hospital Stay (HOSPITAL_COMMUNITY)
Admission: AD | Admit: 2013-08-27 | Discharge: 2013-08-29 | DRG: 776 | Disposition: A | Discharge status: Home / Self Care | Payer: 59 | Source: Ambulatory Visit | Attending: Obstetrics and Gynecology | Admitting: Obstetrics and Gynecology

## 2013-08-27 ENCOUNTER — Other Ambulatory Visit: Payer: Self-pay | Admitting: Obstetrics and Gynecology

## 2013-08-27 DIAGNOSIS — Z800 Family history of malignant neoplasm of digestive organs: Secondary | ICD-10-CM

## 2013-08-27 DIAGNOSIS — O1495 Unspecified pre-eclampsia, complicating the puerperium: Secondary | ICD-10-CM | POA: Diagnosis present

## 2013-08-27 DIAGNOSIS — O9279 Other disorders of lactation: Secondary | ICD-10-CM | POA: Diagnosis present

## 2013-08-27 DIAGNOSIS — Z8049 Family history of malignant neoplasm of other genital organs: Secondary | ICD-10-CM

## 2013-08-27 DIAGNOSIS — O99335 Smoking (tobacco) complicating the puerperium: Secondary | ICD-10-CM | POA: Diagnosis present

## 2013-08-27 DIAGNOSIS — Z87891 Personal history of nicotine dependence: Secondary | ICD-10-CM

## 2013-08-27 DIAGNOSIS — IMO0002 Reserved for concepts with insufficient information to code with codable children: Principal | ICD-10-CM | POA: Diagnosis present

## 2013-08-27 DIAGNOSIS — Z803 Family history of malignant neoplasm of breast: Secondary | ICD-10-CM

## 2013-08-27 HISTORY — DX: Unspecified pre-eclampsia, complicating the puerperium: O14.95

## 2013-08-27 LAB — MRSA PCR SCREENING: MRSA by PCR: NEGATIVE

## 2013-08-27 MED ORDER — MAGNESIUM SULFATE 4000MG/100ML IJ SOLN: 4.0000 g | Freq: Once | INTRAMUSCULAR | Status: DC

## 2013-08-27 MED ORDER — OXYCODONE-ACETAMINOPHEN 5-325 MG PO TABS
1.0000 | ORAL_TABLET | ORAL | Status: DC | PRN
  Administered 2013-08-28 (×2): 1 via ORAL
  Filled 2013-08-27 (×2): qty 1

## 2013-08-27 MED ORDER — PRENATAL MULTIVITAMIN CH
1.0000 | ORAL_TABLET | Freq: Every day | ORAL | Status: DC
  Administered 2013-08-28: 1 via ORAL
  Filled 2013-08-27: qty 1

## 2013-08-27 MED ORDER — IBUPROFEN 600 MG PO TABS
600.0000 mg | ORAL_TABLET | Freq: Four times a day (QID) | ORAL | Status: DC | PRN
  Administered 2013-08-27 – 2013-08-28 (×2): 600 mg via ORAL
  Filled 2013-08-27 (×2): qty 1

## 2013-08-27 MED ORDER — MAGNESIUM SULFATE 40 G IN LACTATED RINGERS - SIMPLE
2.0000 g/h | INTRAVENOUS | Status: AC
  Filled 2013-08-27: qty 500

## 2013-08-27 MED ORDER — LACTATED RINGERS IV SOLN
INTRAVENOUS | Status: DC
  Administered 2013-08-27 – 2013-08-28 (×2): via INTRAVENOUS

## 2013-08-27 MED ORDER — MAGNESIUM SULFATE BOLUS VIA INFUSION
4.0000 g | Freq: Once | INTRAVENOUS | Status: AC
  Administered 2013-08-27: 4 g via INTRAVENOUS
  Filled 2013-08-27: qty 500

## 2013-08-27 MED ORDER — MAGNESIUM SULFATE 40 G IN LACTATED RINGERS - SIMPLE: 2.0000 g/h | INTRAVENOUS | Status: DC

## 2013-08-27 MED ORDER — ALUM & MAG HYDROXIDE-SIMETH 200-200-20 MG/5ML PO SUSP
30.0000 mL | ORAL | Status: DC | PRN
  Filled 2013-08-27: qty 30

## 2013-08-27 MED ORDER — FERROUS SULFATE 325 (65 FE) MG PO TABS
325.0000 mg | ORAL_TABLET | Freq: Two times a day (BID) | ORAL | Status: DC
  Administered 2013-08-28 (×2): 325 mg via ORAL
  Filled 2013-08-27 (×2): qty 1

## 2013-08-27 MED ORDER — HYDRALAZINE HCL 20 MG/ML IJ SOLN: 5.0000 mg | INTRAMUSCULAR | Status: DC | PRN

## 2013-08-27 NOTE — Progress Notes (Signed)
Pt ambulated from MAU & admitted to AICU Rm #[id number]  W/postpartum preeclampsia for IV magnesium.

## 2013-08-27 NOTE — Lactation Note (Signed)
Lactation Consultation Note  Patient Name: Melinda [name] Today's Date: 08/27/2013  Requested to come to AICU to assist with very hungry baby. Mom readmitted for pre-eclampsia. Mom states she has been feeding baby EBM and supplementing with formula with SNS device at breast. Parents state baby is not really latching at breast, but is sort of lapping milk up. Mom able to easily hand express milk from right breast, baby has not been fed in 5.5 hours and is too frantic to latch. Parents have brought formula and bottles to hospital. FOB fed baby 60mls of formula while mom pumped with DEBP. Mom able to pump close to 60mls. Enc mom to hand express after pumping. Plan is to offer breast first, then bottle with EBM, then formula if baby still hungry. Enc mom to ask for assistance latching baby at next feed as needed. Enc mom to feed with cues.    Maternal Data    Feeding    LATCH Score/Interventions                      Lactation Tools Discussed/Used     Consult Status      [name], [name] 08/27/2013, 11:15 PM

## 2013-08-27 NOTE — H&P (Signed)
33 y/o G2P1A1 s/p primary LTCS due to failure to progress, POD #7.   Pt had GHTN which prompted induction.  Post op course was uncomplicated.  She did not receive Magnesium sulfate b/c BP was mild and pt did not have proteinuria.  Pt presented to office today to see Dr. Landry Mellow to check incision and check BP.  BP in office was 140/100.  Dr. Landry Mellow started pt on Procardia XL 30 mg but pt has not filled rx yet.  Critical lab value called in from [facility] due to protein/creatnine ration 1.5.  Protein was previously less than 0.3  LFTs slightly elevated but not double of normal.  Platelets were also normal.  Pt denies headaches, visual changes or RUQ pain.  Pt reports that "I am peeing a lot."    Pt and husband are very concerned about nursing at the best.  Baby initially lost weight and SMS system was recommended.  They feel baby is not very motivated to latch and nurse if supplementation is not in place.  They had an appointment with their pediatrician today and baby had gained 4 oz.  [place] are also [place]  They would like to discontinue supplemental system and exclusively breast feed.  Pt pumped 30 ml from each breast today.  They are very agreeable to a lactation consult.    Current Medications  Taking   Prenatal Vitamins 28-0.8 MG Tablet   Calcium 150 MG Tablet   Multivitamins Capsule   Iron BID Fenugreek  Allergies:  NKDA  PMH:   No medical problems  Surgical History  Breast Implants 2004   Family History  Father: alive  Mother: alive, Cervical cancer  Paternal Grand Mother: Colon cancer  Maternal Grand Mother: Breast cancer  Brother 1: alive  1 brother(s) - healthy.    Social History  General:  Tobacco use cigarettes: Former smoker, Tobacco history last updated 01/02/2013. no Smoking. no Alcohol. no Caffeine, none. no Recreational drug use. Exercise: yes, 4 x week. Occupation: employed, Personnel officer. Marital  Status: married. Children: none.    Gyn History  Sexual activity currently sexually active.  Periods : every 28 days.  LMP 11/24/12.  Birth control none.  Last pap smear date 11/26/2012.  Last mammogram date N/A.  Abnormal pap smear none.  STD none.  Menarche 77.    OB History  Number of pregnancies 1.  Pregnancy # 1 miscarriage 02/2012.  Pregnancy # 2 Primary LTCS, failure to progress.    VS: 149/84 Gen:  NAD, A&O x3 CV:  RRR Lungs:  CTA bilaterally. Abdomen:  Soft, slightly distended.  Incision -steri strips soiled but not fresh. Extremities: 2+ edema.  Improved from previous. Neuro: DTR 2-3+   Labs   Lab: Total Protein/Creat Urine (Random) ([id number])  Protein,Total,Urine 96.1 H 0.0-15.0 - mg/dL  Protein/Creat Ratio 1509 H 0-200 - mg/g creat          Lab: CBC without Diff  WBC 9.1  4.0-11.0 - K/ul  RBC 3.80 L 4.20-5.40 - M/uL  HGB 12.0  12.0-16.0 - g/dL  HCT 36.7 L 37.0-47.0 - %  MCV 96.6  81.0-99.0 - fL  MCH 31.6  27.0-33.0 - pg  MCHC 32.8  32.0-36.0 - g/dL  RDW 14.1  11.5-15.5 - %  PLT 323  150-400 - K/uL   COLE,TARA 08/27/2013 02:41:53 PM > normal platelets COLE,TARA 08/27/2013 02:41:53 PM > pt to start procardia XL as prescribed 30 mg daily McCoy,Tiffany 08/27/2013  03:29:20 PM > pt aware        Lab: Comp Metabolic Panel  GLUCOSE 072 H 70-99 - mg/dL  BUN 12  6-26 - mg/dL  CREATININE 0.75  0.60-1.30 - mg/dl  eGFR (NON-AFRICAN AMERICAN) 90  >60 - calc  eGFR (AFRICAN AMERICAN) 109  >60 - calc  SODIUM 135 L 136-145 - mmol/L  POTASSIUM 4.7  3.5-5.5 - mmol/L  CHLORIDE 102  98-107 - mmol/L  C02 28  22-32 - mmol/L  ANION GAP 9.6  6.0-20.0 - mmol/L  CALCIUM 9.4  8.6-10.3 - mg/dL  T PROTEIN 6.8  6.0-8.3 - g/dL  ALBUMIN 3.5  3.4-4.8 - g/dL  T.BILI 0.4  0.3-1.0 - mg/dL  ALP 111  38-126 - U/L  AST 53 H 0-39 - U/L  ALT 68 H 0-52 - U/L   COLE,TARA 08/27/2013 02:41:53 PM > normal kidney function.Marland Kitchen liver enzymes only slightly increased does not meet criteria for  severe preecalampsia.. plan to repeat liver enzymes in 1 week COLE,TARA 08/27/2013 02:41:53 PM > pt to start procardia XL as prescribed 30 mg daily McCoy,Tiffany 08/27/2013 03:29:20 PM > pt aware, will place lab order        Lab: Uric Acid  URIC ACID 7.6  H 2.3-6.6 - mg/dL   COLE,TARA 08/27/2013 02:41:53 PM > will monitor and repeat in 1 week COLE,TARA 08/27/2013 02:41:53 PM > pt to start procardia XL as prescribed 30 mg daily McCoy,Tiffany 08/27/2013 03:29:20 PM > pt aware, will place lab order        Lab: Urinalysis Dip w/ reflex if positive  Specific Gravity 1.010  1.010-1.030 -   pH 6.0  5.0-8.0 -   Leukocyte Esterase TRACE  Negative -   Blood 3+  Negative - ERY/UL  Glucose NEGATIVE  Negative - g/dL  Nitrite NEGATIVE  Negative -   Protein 2+  Negative - mg/dL  Ketones NEGATIVE  Negative - mg/dL  Urine Bilirubin NEGATIVE  Negative -   Urobilinogen 0.2  0.0-1.0 - mg/dL   COLE,TARA 08/27/2013 02:41:53 PM > normal s/p delivery COLE,TARA 08/27/2013 02:41:53 PM > pt to start procardia XL as prescribed 30 mg daily McCoy,Tiffany 08/27/2013 03:29:20 PM > pt aware   Assessments   1. Postpartum preeclampsia.  H/o GHTN.  BPs currently mild.  Mild elevation in AST/ALT.  2.  S/p Cesarean section-doing well post op. 3.  Difficulty breast feeding.  Treatment  1.  Magnesium x 24 hours.  Monitor UOP closely.  Repeat labs 6 hours s/p bolus for Magnesium level. Monitor BP closely.  Hydralazine 5 mg if SBP >165, DBP>105.  Procardia XL 30 mg s/p magnesium. 2.  Percocet and Motrin prn pain. 3.  Discussed pt's concerns with RN.  Lactation consult ordered.  Lactation support overnight. Plan of care discussed with pt and family at bedside at length.

## 2013-08-28 LAB — CBC
HEMATOCRIT: 33 % — AB (ref 36.0–46.0)
HEMOGLOBIN: 11.2 g/dL — AB (ref 12.0–15.0)
MCH: 32.3 pg (ref 26.0–34.0)
MCHC: 33.9 g/dL (ref 30.0–36.0)
MCV: 95.1 fL (ref 78.0–100.0)
Platelets: 293 10*3/uL (ref 150–400)
RBC: 3.47 MIL/uL — ABNORMAL LOW (ref 3.87–5.11)
RDW: 13.8 % (ref 11.5–15.5)
WBC: 8.3 10*3/uL (ref 4.0–10.5)

## 2013-08-28 LAB — LACTATE DEHYDROGENASE: LDH: 274 U/L — ABNORMAL HIGH (ref 94–250)

## 2013-08-28 LAB — COMPREHENSIVE METABOLIC PANEL
ALT: 60 U/L — ABNORMAL HIGH (ref 0–35)
AST: 46 U/L — AB (ref 0–37)
Albumin: 2.6 g/dL — ABNORMAL LOW (ref 3.5–5.2)
Alkaline Phosphatase: 121 U/L — ABNORMAL HIGH (ref 39–117)
Anion gap: 11 (ref 5–15)
BUN: 12 mg/dL (ref 6–23)
CHLORIDE: 100 meq/L (ref 96–112)
CO2: 26 mEq/L (ref 19–32)
CREATININE: 0.72 mg/dL (ref 0.50–1.10)
Calcium: 8.6 mg/dL (ref 8.4–10.5)
GFR calc Af Amer: >90 mL/min (ref >90)
GLUCOSE: 99 mg/dL (ref 70–99)
Potassium: 4.5 mEq/L (ref 3.7–5.3)
Sodium: 137 mEq/L (ref 137–147)
Total Bilirubin: 0.2 mg/dL — ABNORMAL LOW (ref 0.3–1.2)
Total Protein: 6.1 g/dL (ref 6.0–8.3)

## 2013-08-28 LAB — URIC ACID: Uric Acid, Serum: 7.2 mg/dL — ABNORMAL HIGH (ref 2.4–7.0)

## 2013-08-28 LAB — MAGNESIUM: MAGNESIUM: 4 mg/dL — AB (ref 1.5–2.5)

## 2013-08-28 NOTE — Lactation Note (Signed)
Lactation Consultation Note Called by RN to assist latch. Baby rooting, assisted in football hold, and positioned w/pillows, baby obtained deep latch. Demonstrated wide flange, noted good long tugs, audible swallows. Moms breast are filling, feeling slightly "knotty" near areola, massage and compression demonstrated. Encouraged cheek to breast for latch depth, chin tug demonstrated. No need for SNS, baby to breast and suckling great! Mom is still to post-pump breast for short feedings, and if feeds for 30 min. Or more, to pump on unfed side, and give that colostrum to baby. Dad at bedside for teaching and attentive. Discussed engorgement prevention, hand expression, and ice if needed. Of course BF every 2-3 hrs. And not to go 4-5 hrs. Where baby is mad screaming from hunger. Watch for feeding cues and feed on demand. Patient Name: Melinda NestJessica Osoria Today's Date: 08/28/2013     Maternal Data    Feeding    LATCH Score/Interventions                      Lactation Tools Discussed/Used     Consult Status      Zaviyar Rahal, Diamond NickelLAURA G 08/28/2013, 1:09 AM

## 2013-08-28 NOTE — Progress Notes (Signed)
Subjective: HD #2 Postpartum Preeclampsia Postop Day 8: Cesarean Delivery No complaints.  Denies visual changes or SOB.  No headaches. Worked with lactation overnight.  Breast feeding is better.  Objective: Temp:  [97.8 F (36.6 C)-98.5 F (36.9 C)] 98 F (36.7 C) (07/02 0420) Pulse Rate:  [64-79] 76 (07/02 0500) Resp:  [18-20] 18 (07/02 0203) BP: (121-149)/(70-98) 121/81 mmHg (07/02 0500) SpO2:  [97 %-100 %] 98 % (07/02 0500) Weight:  [94.303 kg (207 lb 14.4 oz)] 94.303 kg (207 lb 14.4 oz) (07/01 1930) UOP 600/500/500/700  Physical Exam: Gen: NAD CV:RRR Lung: CTA bilaterally Abdomen:  Soft, active BS Lochia: Not visualized Neuro:  DTR 2-3+ DVT Evaluation:  Edema present, no calf tenderness bilaterally    Recent Labs  08/28/13 0140  HGB 11.2*  HCT 33.0*   LFTS stable.  Cr normal. Uric acid 7.2 LDH normal.  Platelets normal. Magnesium level 4.0.  Assessment/Plan: Postpartum preeclampsia on Magnesium sulfate.  BP mildly elevated overnight, now normal.  Diuresis is good.  Labs stable. Status post C-section-doing well postoperatively.  Continue Magnesium sulfate for now.  Consider discontinuing before 24 hours if BP remains normal. Lactation support. Dr. Charlotta Newtonzan covering after 7 am today.   Geryl RankinsVARNADO, Breelle Hollywood 08/28/2013, 5:53 AM

## 2013-08-28 NOTE — Progress Notes (Signed)
HD #2 Postpartum Preeclampsia Postop Day 8: Cesarean Delivery  S: Patient doing well, notes slight headache that she rates a 1/10.  No F/C/CP/SOB.  No blurry vision, no RUQ pain.  Tolerating general diet.  + flatus, +BM.  Voiding without difficulty.  Breastfeeding with some difficulty, pt has been seen by lactation.   Objective: Temp:  [97.8 F (36.6 C)-98.5 F (36.9 C)] 98 F (36.7 C) (07/02 1100) Pulse Rate:  [61-93] 74 (07/02 1700) Resp:  [16-20] 16 (07/02 1700) BP: (120-149)/(70-106) 128/106 mmHg (07/02 1700) SpO2:  [96 %-100 %] 100 % (07/02 1700) Weight:  [87.499 kg (192 lb 14.4 oz)-94.303 kg (207 lb 14.4 oz)] 87.499 kg (192 lb 14.4 oz) (07/02 0800) UOP: 4700cc/8hr Last BP- false reading- BP cuff was not on her  Arm  Physical Exam: Gen: NAD CV:RRR Lung: CTA bilaterally Abdomen:  Soft, active BS, uterus firm, non-tender below umbilicus Incision: clean/dry and intact with steris Neuro:  DTR 2-3+, no clonus DVT Evaluation:  1+ pitting edema- continues to improve, no calf tenderness bilaterally   Assessment/Plan: 33yo G1P1001 s/p primary C-section POD #8, re-admitted for postpartum preeclampsia. -On Magnesium x 24hr, will discontinue @ 1930 tonight.  Will follow BPs without medications, should BPs become elevated above 140/90s will start Procardia XL -labs stable as above Status post C-section-doing well postoperatively.  DISPO: Plan to transfer out of ICU.  If BP stable off magnesium will plan for discharge home tomorrow.   Melinda [name], Melinda [name], M 08/28/2013, 5:48 PM

## 2013-08-29 NOTE — Progress Notes (Signed)
Pt. Is discharged in the care of  [facility] per ambulatory with baby in strooler. Stable.Discharge instructions with Rx given No equipment needed for home use. Denies pain or discomfort.

## 2013-08-29 NOTE — Discharge Instructions (Signed)
Preeclampsia and Eclampsia Preeclampsia is a serious condition that develops only during pregnancy. It is also called toxemia of pregnancy. This condition causes high blood pressure along with other symptoms, such as swelling and headaches. These may develop as the condition gets worse. Preeclampsia may occur 20 weeks or later into your pregnancy.  Diagnosing and treating preeclampsia early is very important. If not treated early, it can cause serious problems for you and your baby. One problem it can lead to is eclampsia, which is a condition that causes muscle jerking or shaking (convulsions) in the mother. Delivering your baby is the best treatment for preeclampsia or eclampsia.   SIGNS AND SYMPTOMS  The earliest signs of preeclampsia are:  High blood pressure.  Increased protein in your urine. Your health care provider will check for this at every prenatal visit. Other symptoms that can develop include:   Severe headaches.  Sudden weight gain.  Swelling of your hands, face, legs, and feet.  Feeling sick to your stomach (nauseous) and throwing up (vomiting).  Vision problems (blurred or double vision).  Numbness in your face, arms, legs, and feet.  Dizziness.  Slurred speech.  Sensitivity to bright lights.  Abdominal pain. DIAGNOSIS  There are no screening tests for preeclampsia. Your health care provider will ask you about symptoms and check for signs of preeclampsia during your prenatal visits. You may also have tests, including:  Urine testing.  Blood testing.  Checking your baby's heart rate.  Checking the health of your baby and your placenta using images created with sound waves (ultrasound). TREATMENT  You can work out the best treatment approach together with your health care provider. It is very important to keep all prenatal appointments. If you have an increased risk of preeclampsia, you may need more frequent prenatal exams.  Your health care provider may  prescribe bed rest.  You may have to eat as little salt as possible.  You may need to take medicine to lower your blood pressure if the condition does not respond to more conservative measures.  You may need to stay in the hospital if your condition is severe. There, treatment will focus on controlling your blood pressure and fluid retention. You may also need to take medicine to prevent seizures.  If the condition gets worse, your baby may need to be delivered early to protect you and the baby. You may have your labor started with medicine (be induced), or you may have a cesarean delivery.  Preeclampsia usually goes away after the baby is born. HOME CARE INSTRUCTIONS   Only take over-the-counter or prescription medicines as directed by your health care provider.  Please return to taking the medications you were previously discharged home on.  Elevate your feet while resting.  Do not smoke.  Drink 6-8 glasses of water every day.  Eat a balanced diet that is low in salt. Do not add salt to your food.  Avoid stressful situations as much as possible.  Get plenty of rest and sleep.  Keep all prenatal appointments and tests as scheduled.  Activity: Continue pelvic rest, nothing in the vagina for 4 weeks following your delivery.  No heavy lifting. SEEK MEDICAL CARE IF:  You are gaining more weight than expected.  You have any headaches, abdominal pain, or nausea.  You are bruising more than usual.  You feel dizzy or light-headed. SEEK IMMEDIATE MEDICAL CARE IF:   You develop sudden or severe swelling anywhere in your body. This usually happens in the  legs.  You gain 5 lb (2.3 kg) or more in a week.  You have a severe headache, dizziness, problems with your vision, or confusion.  You have severe abdominal pain.  You have lasting nausea or vomiting.  You have a seizure.  You have trouble moving any part of your body.  You develop numbness in your body.  You have  trouble speaking.  You have any abnormal bleeding.  You develop a stiff neck.  You pass out. MAKE SURE YOU:   Understand these instructions.  Will watch your condition.  Will get help right away if you are not doing well or get worse. Document Released: 02/11/2000 Document Revised: 02/18/2013 Document Reviewed: 12/06/2012 [facility] Patient Information 2015 [employer] [place]. This information is not intended to replace advice given to you by your health care provider. Make sure you discuss any questions you have with your health care provider.

## 2013-08-29 NOTE — Progress Notes (Signed)
HD #3 Postpartum Preeclampsia Postop Day 9: Cesarean Delivery  S: Patient doing well, headache has now resolved.  No F/C/CP/SOB.  No blurry vision, no RUQ pain.  Tolerating general diet.  + flatus, +BM.  Voiding without difficulty.  No acute complaints this am.  Objective: Temp:  [97.8 F (36.6 C)-98.2 F (36.8 C)] 98.2 F (36.8 C) (07/03 0513) Pulse Rate:  [63-93] 63 (07/03 0513) Resp:  [16-18] 16 (07/03 0513) BP: (115-144)/(72-106) 129/85 mmHg (07/03 0513) SpO2:  [97 %-100 %] 99 % (07/03 0513) Weight:  [87.499 kg (192 lb 14.4 oz)-93.668 kg (206 lb 8 oz)] 93.668 kg (206 lb 8 oz) (07/03 0513) UOP: 800cc/8hr  Physical Exam: Gen: NAD CV:RRR Lung: CTA bilaterally Abdomen:  Soft, active BS, uterus firm, non-tender below umbilicus Incision: clean/dry and intact with steris Neuro:  DTR 2+, no clonus DVT Evaluation:  1+ pitting edema-improving, no calf tenderness bilaterally   Assessment/Plan: 33yo G1P1001 s/p primary C-section POD #9, re-admitted for postpartum preeclampsia. -s/p Magnesium x 24hr, BP stable off Magnesium and off medication.  Will hold off on oral medication at this time. -Status post C-section-doing well postoperatively.  DISPO: Discharge home today with follow up in one week.   Myna HidalgoZAN, Shannie Kontos, M 08/29/2013, 7:19 AM

## 2013-08-31 ENCOUNTER — Inpatient Hospital Stay (HOSPITAL_COMMUNITY): Admission: AD | Admit: 2013-08-31 | Payer: Self-pay | Source: Ambulatory Visit | Admitting: Obstetrics and Gynecology

## 2013-09-01 ENCOUNTER — Ambulatory Visit (HOSPITAL_COMMUNITY): Payer: BC Managed Care – PPO

## 2013-09-01 NOTE — Discharge Summary (Signed)
Physician Discharge Summary  Patient ID: Melinda [name] MRN: [id number] DOB/AGE: 05-21-1980 33 y.o.  Admit date: 08/27/2013 Discharge date: 08/29/2013  Admission Diagnoses: Preeclampsia in postpartum period  Discharge Diagnoses:  Active Problems:   Preeclampsia in postpartum period   Discharged Condition: stable  Hospital Course: 33 y/o G2P1A1 s/p primary LTCS due to failure to progress, POD #7 admitted for postpartum preeclampsia.  She was diagnosed with gestational hypertension during her peripartum course and presented in the office for a BP and wound check.  Her blood pressure was noted to be elevated and PC ratio was found to be 1.5.  She was admitted for monitoring and a 24hr course of IV Magnesium.  Her BP remained stable after the Magnesium and was discharged home on Day 3 of her hospital course in stable condition.  Pt to have outpatient follow up in one week.   Consults: None   Treatments: IV hydration, IV Magnesium, monitoring of I/Os, analgesia: Percocet and Motrin  Discharge Exam: Blood pressure 129/85, pulse 63, temperature 98.2 F (36.8 C), temperature source Oral, resp. rate 16, weight 93.668 kg (206 lb 8 oz), last menstrual period 11/24/2012, SpO2 99.00%, unknown if currently breastfeeding. Physical Exam:  Gen: NAD  CV:RRR  Lung: CTA bilaterally  Abdomen: Soft, active BS, uterus firm, non-tender below umbilicus  Incision: clean/dry and intact with steris  Neuro: DTR 2+, no clonus  DVT Evaluation: 1+ pitting edema-improving, no calf tenderness bilaterally   Disposition: 01-Home or Self Care     Medication List         CALCIUM PO  Take 1 tablet by mouth daily.     docusate sodium 100 MG capsule  Commonly known as:  COLACE  Take 1 capsule (100 mg total) by mouth 2 (two) times daily.     ferrous sulfate 325 (65 FE) MG tablet  Commonly known as:  FERROUSUL  Take 1 tablet (325 mg total) by mouth 2 (two) times daily with a meal.     ibuprofen 800 MG tablet   Commonly known as:  ADVIL,MOTRIN  Take 1 tablet (800 mg total) by mouth every 8 (eight) hours as needed.     OVER THE COUNTER MEDICATION  Take 2 capsules by mouth every 4 (four) hours. Patient stated that the medication name is Fenugreek, which is an herbal supplement     oxyCODONE-acetaminophen 5-325 MG per tablet  Commonly known as:  ROXICET  Take 1 tablet by mouth every 4 (four) hours as needed for severe pain.     prenatal multivitamin Tabs tablet  Take 1 tablet by mouth daily at 12 noon.     simethicone 80 MG chewable tablet  Commonly known as:  MYLICON  Chew 80 mg by mouth every 6 (six) hours as needed for flatulence.           Follow-up Information   Follow up with Jessee AversOLE,TARA J., MD In 1 week.   Specialty:  Obstetrics and Gynecology   Contact information:   [address] [place] [address] [phone]       Signed: Sharon SellerOZAN, Zamoria Boss, M 09/01/2013, 8:47 AM

## 2013-10-10 ENCOUNTER — Encounter (HOSPITAL_COMMUNITY)
Admission: RE | Admit: 2013-10-10 | Discharge: 2013-10-10 | Disposition: A | Discharge status: Home / Self Care | Payer: 59 | Source: Ambulatory Visit | Attending: Obstetrics and Gynecology | Admitting: Obstetrics and Gynecology

## 2013-10-10 DIAGNOSIS — O923 Agalactia: Secondary | ICD-10-CM | POA: Insufficient documentation

## 2013-11-11 ENCOUNTER — Encounter (HOSPITAL_COMMUNITY)
Admission: RE | Admit: 2013-11-11 | Discharge: 2013-11-11 | Disposition: A | Discharge status: Home / Self Care | Payer: 59 | Source: Ambulatory Visit | Attending: Obstetrics and Gynecology | Admitting: Obstetrics and Gynecology

## 2013-11-11 DIAGNOSIS — O923 Agalactia: Secondary | ICD-10-CM | POA: Insufficient documentation

## 2013-12-02 ENCOUNTER — Other Ambulatory Visit: Payer: Self-pay | Admitting: Obstetrics and Gynecology

## 2013-12-02 ENCOUNTER — Other Ambulatory Visit (HOSPITAL_COMMUNITY)
Admission: RE | Admit: 2013-12-02 | Discharge: 2013-12-02 | Disposition: A | Discharge status: Home / Self Care | Payer: 59 | Source: Ambulatory Visit | Attending: Obstetrics and Gynecology | Admitting: Obstetrics and Gynecology

## 2013-12-02 DIAGNOSIS — Z01419 Encounter for gynecological examination (general) (routine) without abnormal findings: Secondary | ICD-10-CM | POA: Diagnosis present

## 2013-12-02 DIAGNOSIS — Z1151 Encounter for screening for human papillomavirus (HPV): Secondary | ICD-10-CM | POA: Diagnosis present

## 2013-12-03 LAB — CYTOLOGY - PAP

## 2013-12-11 ENCOUNTER — Encounter (HOSPITAL_COMMUNITY)
Admission: RE | Admit: 2013-12-11 | Discharge: 2013-12-11 | Disposition: A | Discharge status: Home / Self Care | Payer: 59 | Source: Ambulatory Visit | Attending: Obstetrics and Gynecology | Admitting: Obstetrics and Gynecology

## 2013-12-11 DIAGNOSIS — O923 Agalactia: Secondary | ICD-10-CM | POA: Insufficient documentation

## 2013-12-29 ENCOUNTER — Encounter (HOSPITAL_COMMUNITY): Payer: Self-pay | Admitting: Obstetrics and Gynecology

## 2014-01-11 ENCOUNTER — Encounter (HOSPITAL_COMMUNITY)
Admission: RE | Admit: 2014-01-11 | Discharge: 2014-01-11 | Disposition: A | Discharge status: Home / Self Care | Payer: 59 | Source: Ambulatory Visit | Attending: Obstetrics and Gynecology | Admitting: Obstetrics and Gynecology

## 2014-01-11 DIAGNOSIS — O923 Agalactia: Secondary | ICD-10-CM | POA: Insufficient documentation

## 2014-07-07 NOTE — H&P (Signed)
L&D Evaluation:  History:  HPI 34 yo wf G1P0 at 7232 weeks gestation   Presents with decreased fetal movement   Patient's Medical History No Chronic Illness   Patient's Surgical History none   Medications Pre Natal Vitamins   Allergies NKDA   Social History none   Family History Non-Contributory   ROS:  ROS All systems were reviewed.  HEENT, CNS, GI, GU, Respiratory, CV, Renal and Musculoskeletal systems were found to be normal.   Exam:  Vital Signs stable   Urine Protein not completed   General no apparent distress   Mental Status clear   FHT normal rate with no decels, Reactive NST   Ucx irregular   Impression:  Impression decreased fetal movement, reactive NST   Plan:  Plan [facility] BID; Routine OB F/U   Follow Up Appointment already scheduled. [facility]   Electronic Signatures: Kacyn Souder, Prentice DockerMartin A (MD)  (Signed 02-Jun-15 10:29)  Authored: L&D Evaluation   Last Updated: 02-Jun-15 10:29 by Victoriah Wilds, Prentice DockerMartin A (MD)

## 2014-07-14 LAB — OB RESULTS CONSOLE RUBELLA ANTIBODY, IGM: RUBELLA: IMMUNE

## 2014-07-14 LAB — OB RESULTS CONSOLE RPR: RPR: NONREACTIVE

## 2014-07-14 LAB — OB RESULTS CONSOLE HEPATITIS B SURFACE ANTIGEN: HEP B S AG: NEGATIVE

## 2014-07-14 LAB — OB RESULTS CONSOLE ABO/RH: RH Type: POSITIVE

## 2014-07-14 LAB — OB RESULTS CONSOLE HIV ANTIBODY (ROUTINE TESTING): HIV: NONREACTIVE

## 2014-07-14 LAB — OB RESULTS CONSOLE ANTIBODY SCREEN: Antibody Screen: NEGATIVE

## 2014-07-22 LAB — OB RESULTS CONSOLE GC/CHLAMYDIA
CHLAMYDIA, DNA PROBE: NEGATIVE
GC PROBE AMP, GENITAL: NEGATIVE

## 2015-01-31 ENCOUNTER — Encounter (HOSPITAL_COMMUNITY): Payer: Self-pay | Admitting: *Deleted

## 2015-01-31 ENCOUNTER — Inpatient Hospital Stay (HOSPITAL_COMMUNITY)
Admission: AD | Admit: 2015-01-31 | Discharge: 2015-02-01 | Disposition: A | Discharge status: Home / Self Care | Payer: 59 | Source: Ambulatory Visit | Attending: Obstetrics and Gynecology | Admitting: Obstetrics and Gynecology

## 2015-01-31 ENCOUNTER — Inpatient Hospital Stay (HOSPITAL_COMMUNITY): Payer: 59

## 2015-01-31 DIAGNOSIS — R072 Precordial pain: Secondary | ICD-10-CM | POA: Insufficient documentation

## 2015-01-31 DIAGNOSIS — O26893 Other specified pregnancy related conditions, third trimester: Secondary | ICD-10-CM | POA: Diagnosis present

## 2015-01-31 DIAGNOSIS — Z3A35 35 weeks gestation of pregnancy: Secondary | ICD-10-CM | POA: Diagnosis not present

## 2015-01-31 DIAGNOSIS — R079 Chest pain, unspecified: Secondary | ICD-10-CM

## 2015-01-31 DIAGNOSIS — O9989 Other specified diseases and conditions complicating pregnancy, childbirth and the puerperium: Secondary | ICD-10-CM | POA: Diagnosis not present

## 2015-01-31 DIAGNOSIS — O163 Unspecified maternal hypertension, third trimester: Secondary | ICD-10-CM | POA: Insufficient documentation

## 2015-01-31 HISTORY — DX: Essential (primary) hypertension: I10

## 2015-01-31 LAB — CBC
HCT: 37.4 % (ref 36.0–46.0)
HEMOGLOBIN: 13 g/dL (ref 12.0–15.0)
MCH: 32 pg (ref 26.0–34.0)
MCHC: 34.8 g/dL (ref 30.0–36.0)
MCV: 92.1 fL (ref 78.0–100.0)
PLATELETS: 145 10*3/uL — AB (ref 150–400)
RBC: 4.06 MIL/uL (ref 3.87–5.11)
RDW: 12.9 % (ref 11.5–15.5)
WBC: 13.1 10*3/uL — AB (ref 4.0–10.5)

## 2015-01-31 LAB — PROTEIN / CREATININE RATIO, URINE
CREATININE, URINE: 89 mg/dL
Protein Creatinine Ratio: 0.08 mg/mg{Cre} (ref 0.00–0.15)
TOTAL PROTEIN, URINE: 7 mg/dL

## 2015-01-31 LAB — COMPREHENSIVE METABOLIC PANEL
ALBUMIN: 2.7 g/dL — AB (ref 3.5–5.0)
ALT: 18 U/L (ref 14–54)
ANION GAP: 6 (ref 5–15)
AST: 29 U/L (ref 15–41)
Alkaline Phosphatase: 82 U/L (ref 38–126)
BUN: 10 mg/dL (ref 6–20)
CHLORIDE: 105 mmol/L (ref 101–111)
CO2: 23 mmol/L (ref 22–32)
Calcium: 9.1 mg/dL (ref 8.9–10.3)
Creatinine, Ser: 0.65 mg/dL (ref 0.44–1.00)
GFR calc Af Amer: >60 mL/min (ref >60)
GFR calc non Af Amer: >60 mL/min (ref >60)
Glucose, Bld: 88 mg/dL (ref 65–99)
POTASSIUM: 4.1 mmol/L (ref 3.5–5.1)
Sodium: 134 mmol/L — ABNORMAL LOW (ref 135–145)
Total Bilirubin: 0.7 mg/dL (ref 0.3–1.2)
Total Protein: 5.9 g/dL — ABNORMAL LOW (ref 6.5–8.1)

## 2015-01-31 MED ORDER — IOHEXOL 350 MG/ML SOLN
100.0000 mL | Freq: Once | INTRAVENOUS | Status: AC | PRN
  Administered 2015-01-31: 100 mL via INTRAVENOUS

## 2015-01-31 MED ORDER — GI COCKTAIL ~~LOC~~
30.0000 mL | Freq: Once | ORAL | Status: AC
  Administered 2015-01-31: 30 mL via ORAL
  Filled 2015-01-31: qty 30

## 2015-01-31 MED ORDER — ACETAMINOPHEN 325 MG PO TABS
650.0000 mg | ORAL_TABLET | Freq: Once | ORAL | Status: AC
  Administered 2015-01-31: 650 mg via ORAL
  Filled 2015-01-31: qty 2

## 2015-01-31 NOTE — MAU Note (Signed)
Pt reports pain at her sternum today that which has is been getting progressively worse throughout the day.  Pt denies headache today or visual changes

## 2015-01-31 NOTE — MAU Provider Note (Signed)
Chief Complaint:  Chest Pain  HPI  Melinda [name] is a 34 y.o. G2P1000 at 7235w3dwho presents to maternity admissions reporting substernal pain which shoots through to her back.  Has been getting worse all day. Denies cardiac history.  Denies palpitations.  Nothing makes it better or worse. She reports good fetal movement, denies LOF, vaginal bleeding, vaginal itching/burning, urinary symptoms, h/a, dizziness, n/v, or fever/chills.  She denies headache, visual changes or abdominal pain.  RN Note: Pt reports pain at her sternum today that which has is been getting progressively worse throughout the day. Pt denies headache today or visual changes  Past Medical History: Past Medical History  Diagnosis Date  . Vaginal Pap smear, abnormal     ASCUS    Past obstetric history: OB History  Gravida Para Term Preterm AB SAB TAB Ectopic Multiple Living  2 1 1            # Outcome Date GA Lbr Len/2nd Weight Sex Delivery Anes PTL Lv  2 Current           1 Term 08/20/13 8162w3d  3.455 kg (7 lb 9.9 oz) F CS-LTranv EPI        Past Surgical History: Past Surgical History  Procedure Laterality Date  . Breast surgery      implants  . Cesarean section N/A 08/20/2013    Procedure: CESAREAN SECTION;  Surgeon: Dorien Chihuahuaara J. Richardson Doppole, MD;  Location: [facility];  Service: Obstetrics;  Laterality: N/A;    Family History: Family History  Problem Relation Age of Onset  . Alcohol abuse Father   . Cancer Maternal Grandmother   . Varicose Veins Maternal Grandmother   . Cancer Paternal Grandmother   . Arthritis Neg Hx   . Asthma Neg Hx   . Birth defects Neg Hx   . COPD Neg Hx   . Depression Neg Hx   . Diabetes Neg Hx   . Drug abuse Neg Hx   . Early death Neg Hx   . Hearing loss Neg Hx   . Heart disease Neg Hx   . Hyperlipidemia Neg Hx   . Hypertension Neg Hx   . Kidney disease Neg Hx   . Learning disabilities Neg Hx   . Mental illness Neg Hx   . Mental retardation Neg Hx   . Miscarriages / Stillbirths Neg  Hx   . Stroke Neg Hx   . Vision loss Neg Hx     Social History: Social History  Substance Use Topics  . Smoking status: Never Smoker   . Smokeless tobacco: Never Used  . Alcohol Use: No    Allergies: No Known Allergies  Meds:  Prescriptions prior to admission  Medication Sig Dispense Refill Last Dose  . CALCIUM PO Take 1 tablet by mouth daily.   08/26/2013 at Unknown time  . docusate sodium (COLACE) 100 MG capsule Take 1 capsule (100 mg total) by mouth 2 (two) times daily. 60 capsule 2 08/27/2013 at 1400  . ferrous sulfate (FERROUSUL) 325 (65 FE) MG tablet Take 1 tablet (325 mg total) by mouth 2 (two) times daily with a meal. 100 tablet 1 08/27/2013 at 0900  . ibuprofen (ADVIL,MOTRIN) 800 MG tablet Take 1 tablet (800 mg total) by mouth every 8 (eight) hours as needed. 50 tablet 1 08/27/2013 at 1400  . OVER THE COUNTER MEDICATION Take 2 capsules by mouth every 4 (four) hours. Patient stated that the medication name is Fenugreek, which is an herbal supplement   08/27/2013  at 1400  . oxyCODONE-acetaminophen (ROXICET) 5-325 MG per tablet Take 1 tablet by mouth every 4 (four) hours as needed for severe pain. 30 tablet 0 08/27/2013 at 0900  . Prenatal Vit-Fe Fumarate-FA (PRENATAL MULTIVITAMIN) TABS tablet Take 1 tablet by mouth daily at 12 noon.   08/26/2013 at Unknown time  . simethicone (MYLICON) 80 MG chewable tablet Chew 80 mg by mouth every 6 (six) hours as needed for flatulence.   08/27/2013 at 0990   ROS:  Review of Systems  Constitutional: Negative for fever and chills.  Eyes: Negative for photophobia and visual disturbance.  Respiratory: Negative for cough, chest tightness and shortness of breath.   Cardiovascular: Positive for chest pain. Negative for palpitations and leg swelling (maybe some mild ankle swelling).  Gastrointestinal: Negative for nausea, vomiting, abdominal pain, diarrhea, constipation and abdominal distention.  Genitourinary: Negative for dysuria, frequency, hematuria,  vaginal bleeding and vaginal discharge.  Musculoskeletal: Negative for back pain.  Neurological: Negative for dizziness and syncope.   I have reviewed patient's Past Medical Hx, Surgical Hx, Family Hx, Social Hx, medications and allergies.   Physical Exam  Patient Vitals for the past 24 hrs:  BP Temp Temp src Pulse Resp Height Weight  01/31/15 1902 145/88 mmHg 98.9 F (37.2 C) Oral 80 16  (1.702 m) 89.812 kg (198 lb)   Filed Vitals:   01/31/15 1916 01/31/15 1918 01/31/15 1921 01/31/15 1931  BP:  124/87 129/95 124/76  Pulse: 79 74 75 72  Temp:      TempSrc:      Resp:      Height:      Weight:      SpO2: 99%      Constitutional: Well-developed, well-nourished female in no acute distress.  Cardiovascular: normal rate and rhythm, no irregular beats heard, soft systolic murmur Respiratory: normal effort, clear to auscultation bilaterally GI: Abd soft, non-tender, gravid appropriate for gestational age.  MS: Extremities nontender, trace pedal edema, normal ROM Neurologic: Alert and oriented x 4. DTRs 2+/no clonus GU: Neg CVAT.  FHT:  Baseline 140 , moderate variability, accelerations present, no decelerations Contractions: q 4-6 mins Irregular    Labs: Results for orders placed or performed during the hospital encounter of 01/31/15 (from the past 24 hour(s))  Protein / creatinine ratio, urine     Status: None   Collection Time: 01/31/15  6:50 PM  Result Value Ref Range   Creatinine, Urine 89.00 mg/dL   Total Protein, Urine 7 mg/dL   Protein Creatinine Ratio 0.08 0.00 - 0.15 mg/mg[Cre]  CBC     Status: Abnormal   Collection Time: 01/31/15  7:20 PM  Result Value Ref Range   WBC 13.1 (H) 4.0 - 10.5 K/uL   RBC 4.06 3.87 - 5.11 MIL/uL   Hemoglobin 13.0 12.0 - 15.0 g/dL   HCT 16.1 09.6 - 04.5 %   MCV 92.1 78.0 - 100.0 fL   MCH 32.0 26.0 - 34.0 pg   MCHC 34.8 30.0 - 36.0 g/dL   RDW 40.9 81.1 - 91.4 %   Platelets 145 (L) 150 - 400 K/uL  Comprehensive metabolic panel      Status: Abnormal   Collection Time: 01/31/15  7:20 PM  Result Value Ref Range   Sodium 134 (L) 135 - 145 mmol/L   Potassium 4.1 3.5 - 5.1 mmol/L   Chloride 105 101 - 111 mmol/L   CO2 23 22 - 32 mmol/L   Glucose, Bld 88 65 - 99 mg/dL  BUN 10 6 - 20 mg/dL   Creatinine, Ser 0.98 0.44 - 1.00 mg/dL   Calcium 9.1 8.9 - 11.9 mg/dL   Total Protein 5.9 (L) 6.5 - 8.1 g/dL   Albumin 2.7 (L) 3.5 - 5.0 g/dL   AST 29 15 - 41 U/L   ALT 18 14 - 54 U/L   Alkaline Phosphatase 82 38 - 126 U/L   Total Bilirubin 0.7 0.3 - 1.2 mg/dL   GFR calc non Af Amer >60 >60 mL/min   GFR calc Af Amer >60 >60 mL/min   Anion gap 6 5 - 15    Imaging:  No results found. EKG: Normal sinus rhythm with sinus arrhythmia with possible left atrial enlargement  MAU Course/MDM: I have ordered labs and reviewed results.  Consult Dr Dion Body with presentation, exam findings and test results.  Treatments in MAU included GI cocktail with no relief of pain.   Pt stable at time of discharge.  Assessment: [facility] at [redacted]w[redacted]d  Substernal chest pain Labile hypertension  Plan: Dr Dion Body plans to come in and see the patient.    Medication List    ASK your doctor about these medications        CALCIUM PO  Take 1 tablet by mouth daily.     docusate sodium 100 MG capsule  Commonly known as:  COLACE  Take 1 capsule (100 mg total) by mouth 2 (two) times daily.     ferrous sulfate 325 (65 FE) MG tablet  Commonly known as:  FERROUSUL  Take 1 tablet (325 mg total) by mouth 2 (two) times daily with a meal.     ibuprofen 800 MG tablet  Commonly known as:  ADVIL,MOTRIN  Take 1 tablet (800 mg total) by mouth every 8 (eight) hours as needed.     OVER THE COUNTER MEDICATION  Take 2 capsules by mouth every 4 (four) hours. Patient stated that the medication name is Fenugreek, which is an herbal supplement     oxyCODONE-acetaminophen 5-325 MG tablet  Commonly known as:  ROXICET  Take 1 tablet by mouth every 4 (four) hours as  needed for severe pain.     prenatal multivitamin Tabs tablet  Take 1 tablet by mouth daily at 12 noon.     simethicone 80 MG chewable tablet  Commonly known as:  MYLICON  Chew 80 mg by mouth every 6 (six) hours as needed for flatulence.        Wynelle Bourgeois CNM, MSN Certified Nurse-Midwife 01/31/2015 7:07 PM

## 2015-01-31 NOTE — Progress Notes (Signed)
Spiral CT with/without contrast ordered.  Discussed presentation and EKG with Dr. Leeann MustJacob Kelly, Cardiology fellow.  Agrees with plan.  See addendum to provider note.

## 2015-02-01 NOTE — Discharge Instructions (Signed)
Chest Wall Pain Chest wall pain is pain in or around the bones and muscles of your chest. Sometimes, an injury causes this pain. Sometimes, the cause may not be known. This pain may take several weeks or longer to get better. HOME CARE Pay attention to any changes in your symptoms. Take these actions to help with your pain:  Rest as told by your doctor.  Avoid activities that cause pain. Try not to use your chest, belly (abdominal), or side muscles to lift heavy things.  If directed, apply ice to the painful area:  Put ice in a plastic bag.  Place a towel between your skin and the bag.  Leave the ice on for 20 minutes, 2-3 times per day.  Take over-the-counter and prescription medicines only as told by your doctor.  Do not use tobacco products, including cigarettes, chewing tobacco, and e-cigarettes. If you need help quitting, ask your doctor.  Keep all follow-up visits as told by your doctor. This is important. GET HELP IF:  You have a fever.  Your chest pain gets worse.  You have new symptoms. GET HELP RIGHT AWAY IF:  You feel sick to your stomach (nauseous) or you throw up (vomit).  You feel sweaty or light-headed.  You have a cough with phlegm (sputum) or you cough up blood.  You are short of breath.   This information is not intended to replace advice given to you by your health care provider. Make sure you discuss any questions you have with your health care provider.   Document Released: 08/02/2007 Document Revised: 11/04/2014 Document Reviewed: 05/11/2014 Elsevier Interactive Patient Education 2016 [employer]  Chest Pain Observation It is often hard to give a specific diagnosis for the cause of chest pain. Among other possibilities your symptoms might be caused by inadequate oxygen delivery to your heart (angina). Angina that is not treated or evaluated can lead to a heart attack (myocardial infarction) or death. Blood tests, electrocardiograms, and X-rays  may have been done to help determine a possible cause of your chest pain. After evaluation and observation, your health care provider has determined that it is unlikely your pain was caused by an unstable condition that requires hospitalization. However, a full evaluation of your pain may need to be completed, with additional diagnostic testing as directed. It is very important to keep your follow-up appointments. Not keeping your follow-up appointments could result in permanent heart damage, disability, or death. If there is any problem keeping your follow-up appointments, you must call your health care provider. HOME CARE INSTRUCTIONS  Due to the slight chance that your pain could be angina, it is important to follow your health care provider's treatment plan and also maintain a healthy lifestyle:  Maintain or work toward achieving a healthy weight.  Stay physically active and exercise regularly.  Decrease your salt intake.  Eat a balanced, healthy diet. Talk to a dietitian to learn about heart-healthy foods.  Increase your fiber intake by including whole grains, vegetables, fruits, and nuts in your diet.  Avoid situations that cause stress, anger, or depression.  Take medicines as advised by your health care provider. Report any side effects to your health care provider. Do not stop medicines or adjust the dosages on your own.  Quit smoking. Do not use nicotine patches or gum until you check with your health care provider.  Keep your blood pressure, blood sugar, and cholesterol levels within normal limits.  Limit alcohol intake to no more than 1 drink  per day for women who are not pregnant and 2 drinks per day for men.  Do not abuse drugs. SEEK IMMEDIATE MEDICAL CARE IF: You have severe chest pain or pressure which may include symptoms such as:  You feel pain or pressure in your arms, neck, jaw, or back.  You have severe back or abdominal pain, feel sick to your stomach (nauseous),  or throw up (vomit).  You are sweating profusely.  You are having a fast or irregular heartbeat.  You feel short of breath while at rest.  You notice increasing shortness of breath during rest, sleep, or with activity.  You have chest pain that does not get better after rest or after taking your usual medicine.  You wake from sleep with chest pain.  You are unable to sleep because you cannot breathe.  You develop a frequent cough or you are coughing up blood.  You feel dizzy, faint, or experience extreme fatigue.  You develop severe weakness, dizziness, fainting, or chills. Any of these symptoms may represent a serious problem that is an emergency. Do not wait to see if the symptoms will go away. Call your local emergency services (911 in the [place] Do not drive yourself to the hospital. MAKE SURE YOU:  Understand these instructions.  Will watch your condition.  Will get help right away if you are not doing well or get worse.   This information is not intended to replace advice given to you by your health care provider. Make sure you discuss any questions you have with your health care provider.   Document Released: 03/18/2010 Document Revised: 02/18/2013 Document Reviewed: 08/15/2012 Elsevier Interactive Patient Education 2016 [employer]  Chest Pain Observation It is often hard to give a specific diagnosis for the cause of chest pain. Among other possibilities your symptoms might be caused by inadequate oxygen delivery to your heart (angina). Angina that is not treated or evaluated can lead to a heart attack (myocardial infarction) or death. Blood tests, electrocardiograms, and X-rays may have been done to help determine a possible cause of your chest pain. After evaluation and observation, your health care provider has determined that it is unlikely your pain was caused by an unstable condition that requires hospitalization. However, a full evaluation of your pain may need  to be completed, with additional diagnostic testing as directed. It is very important to keep your follow-up appointments. Not keeping your follow-up appointments could result in permanent heart damage, disability, or death. If there is any problem keeping your follow-up appointments, you must call your health care provider. HOME CARE INSTRUCTIONS  Due to the slight chance that your pain could be angina, it is important to follow your health care provider's treatment plan and also maintain a healthy lifestyle:  Maintain or work toward achieving a healthy weight.  Stay physically active and exercise regularly.  Decrease your salt intake.  Eat a balanced, healthy diet. Talk to a dietitian to learn about heart-healthy foods.  Increase your fiber intake by including whole grains, vegetables, fruits, and nuts in your diet.  Avoid situations that cause stress, anger, or depression.  Take medicines as advised by your health care provider. Report any side effects to your health care provider. Do not stop medicines or adjust the dosages on your own.  Quit smoking. Do not use nicotine patches or gum until you check with your health care provider.  Keep your blood pressure, blood sugar, and cholesterol levels within normal limits.  Limit alcohol intake  to no more than 1 drink per day for women who are not pregnant and 2 drinks per day for men.  Do not abuse drugs. SEEK IMMEDIATE MEDICAL CARE IF: You have severe chest pain or pressure which may include symptoms such as:  You feel pain or pressure in your arms, neck, jaw, or back.  You have severe back or abdominal pain, feel sick to your stomach (nauseous), or throw up (vomit).  You are sweating profusely.  You are having a fast or irregular heartbeat.  You feel short of breath while at rest.  You notice increasing shortness of breath during rest, sleep, or with activity.  You have chest pain that does not get better after rest or after  taking your usual medicine.  You wake from sleep with chest pain.  You are unable to sleep because you cannot breathe.  You develop a frequent cough or you are coughing up blood.  You feel dizzy, faint, or experience extreme fatigue.  You develop severe weakness, dizziness, fainting, or chills. Any of these symptoms may represent a serious problem that is an emergency. Do not wait to see if the symptoms will go away. Call your local emergency services (911 in the [place] Do not drive yourself to the hospital. MAKE SURE YOU:  Understand these instructions.  Will watch your condition.  Will get help right away if you are not doing well or get worse.   This information is not intended to replace advice given to you by your health care provider. Make sure you discuss any questions you have with your health care provider.   Document Released: 03/18/2010 Document Revised: 02/18/2013 Document Reviewed: 08/15/2012 Elsevier Interactive Patient Education [employer]

## 2015-02-07 ENCOUNTER — Encounter (HOSPITAL_COMMUNITY): Payer: Self-pay | Admitting: *Deleted

## 2015-02-07 ENCOUNTER — Inpatient Hospital Stay (HOSPITAL_COMMUNITY)
Admission: AD | Admit: 2015-02-07 | Discharge: 2015-02-11 | DRG: 765 | Disposition: A | Discharge status: Home / Self Care | Payer: 59 | Source: Ambulatory Visit | Attending: Obstetrics and Gynecology | Admitting: Obstetrics and Gynecology

## 2015-02-07 ENCOUNTER — Telehealth: Payer: Self-pay | Admitting: Obstetrics and Gynecology

## 2015-02-07 DIAGNOSIS — O99214 Obesity complicating childbirth: Secondary | ICD-10-CM | POA: Diagnosis present

## 2015-02-07 DIAGNOSIS — Z98891 History of uterine scar from previous surgery: Secondary | ICD-10-CM

## 2015-02-07 DIAGNOSIS — O1410 Severe pre-eclampsia, unspecified trimester: Secondary | ICD-10-CM | POA: Diagnosis present

## 2015-02-07 DIAGNOSIS — Z6831 Body mass index (BMI) 31.0-31.9, adult: Secondary | ICD-10-CM

## 2015-02-07 DIAGNOSIS — R109 Unspecified abdominal pain: Secondary | ICD-10-CM

## 2015-02-07 DIAGNOSIS — O34219 Maternal care for unspecified type scar from previous cesarean delivery: Secondary | ICD-10-CM | POA: Diagnosis present

## 2015-02-07 DIAGNOSIS — Z87891 Personal history of nicotine dependence: Secondary | ICD-10-CM

## 2015-02-07 DIAGNOSIS — R030 Elevated blood-pressure reading, without diagnosis of hypertension: Secondary | ICD-10-CM

## 2015-02-07 DIAGNOSIS — IMO0001 Reserved for inherently not codable concepts without codable children: Secondary | ICD-10-CM

## 2015-02-07 DIAGNOSIS — O1414 Severe pre-eclampsia complicating childbirth: Principal | ICD-10-CM | POA: Diagnosis present

## 2015-02-07 DIAGNOSIS — Z3A36 36 weeks gestation of pregnancy: Secondary | ICD-10-CM

## 2015-02-07 DIAGNOSIS — D696 Thrombocytopenia, unspecified: Secondary | ICD-10-CM | POA: Diagnosis present

## 2015-02-07 DIAGNOSIS — E669 Obesity, unspecified: Secondary | ICD-10-CM | POA: Diagnosis present

## 2015-02-07 DIAGNOSIS — R0789 Other chest pain: Secondary | ICD-10-CM | POA: Diagnosis present

## 2015-02-07 DIAGNOSIS — Z3483 Encounter for supervision of other normal pregnancy, third trimester: Secondary | ICD-10-CM | POA: Diagnosis not present

## 2015-02-07 DIAGNOSIS — O9912 Other diseases of the blood and blood-forming organs and certain disorders involving the immune mechanism complicating childbirth: Secondary | ICD-10-CM | POA: Diagnosis present

## 2015-02-07 LAB — URINALYSIS, ROUTINE W REFLEX MICROSCOPIC
BILIRUBIN URINE: NEGATIVE
GLUCOSE, UA: NEGATIVE mg/dL
HGB URINE DIPSTICK: NEGATIVE
Ketones, ur: NEGATIVE mg/dL
Leukocytes, UA: NEGATIVE
Nitrite: NEGATIVE
PH: 6 (ref 5.0–8.0)
Protein, ur: NEGATIVE mg/dL
SPECIFIC GRAVITY, URINE: 1.025 (ref 1.005–1.030)

## 2015-02-07 LAB — CBC
HCT: 33 % — ABNORMAL LOW (ref 36.0–46.0)
Hemoglobin: 11.4 g/dL — ABNORMAL LOW (ref 12.0–15.0)
MCH: 31.7 pg (ref 26.0–34.0)
MCHC: 34.5 g/dL (ref 30.0–36.0)
MCV: 91.7 fL (ref 78.0–100.0)
PLATELETS: 109 10*3/uL — AB (ref 150–400)
RBC: 3.6 MIL/uL — ABNORMAL LOW (ref 3.87–5.11)
RDW: 13.4 % (ref 11.5–15.5)
WBC: 13.1 10*3/uL — ABNORMAL HIGH (ref 4.0–10.5)

## 2015-02-07 LAB — COMPREHENSIVE METABOLIC PANEL
ALT: 31 U/L (ref 14–54)
AST: 42 U/L — ABNORMAL HIGH (ref 15–41)
Albumin: 2.8 g/dL — ABNORMAL LOW (ref 3.5–5.0)
Alkaline Phosphatase: 105 U/L (ref 38–126)
Anion gap: 6 (ref 5–15)
BUN: 16 mg/dL (ref 6–20)
CHLORIDE: 105 mmol/L (ref 101–111)
CO2: 24 mmol/L (ref 22–32)
CREATININE: 0.69 mg/dL (ref 0.44–1.00)
Calcium: 8.5 mg/dL — ABNORMAL LOW (ref 8.9–10.3)
Glucose, Bld: 99 mg/dL (ref 65–99)
POTASSIUM: 4.2 mmol/L (ref 3.5–5.1)
Sodium: 135 mmol/L (ref 135–145)
Total Bilirubin: 0.4 mg/dL (ref 0.3–1.2)
Total Protein: 5.9 g/dL — ABNORMAL LOW (ref 6.5–8.1)

## 2015-02-07 LAB — URIC ACID: Uric Acid, Serum: 6.9 mg/dL — ABNORMAL HIGH (ref 2.3–6.6)

## 2015-02-07 LAB — LACTATE DEHYDROGENASE: LDH: 207 U/L — AB (ref 98–192)

## 2015-02-07 LAB — PROTEIN / CREATININE RATIO, URINE
Creatinine, Urine: 161 mg/dL
Protein Creatinine Ratio: 0.09 mg/mg{Cre} (ref 0.00–0.15)
Total Protein, Urine: 14 mg/dL

## 2015-02-07 MED ORDER — ALUM & MAG HYDROXIDE-SIMETH 200-200-20 MG/5ML PO SUSP: 30.0000 mL | ORAL | Status: DC | PRN

## 2015-02-07 MED ORDER — OXYCODONE-ACETAMINOPHEN 5-325 MG PO TABS
1.0000 | ORAL_TABLET | Freq: Once | ORAL | Status: AC
  Administered 2015-02-07: 1 via ORAL
  Filled 2015-02-07: qty 1

## 2015-02-07 MED ORDER — ONDANSETRON 4 MG PO TBDP
4.0000 mg | ORAL_TABLET | ORAL | Status: AC
  Administered 2015-02-07: 4 mg via ORAL
  Filled 2015-02-07: qty 1

## 2015-02-07 NOTE — MAU Note (Signed)
Pt reports horrible pain in lower sternum area that radiates to mid back, was seen for same on week ago and tylenol has been helping until tonight.

## 2015-02-07 NOTE — Telephone Encounter (Signed)
TC call from [employer]Jessica Carroll, G2P1001, 36.3 wks.  Pt reports hx of preeclampsia in previous pregnancy with recent elevated BP in office and is self monitoring BP at home. States she was advised to call for Systolic BP over 161'W140's. Reports "Systolic over 150's all day" . Pt advised to present to MAU for PIH evaluation. RStall, CNM   Alphonzo Severanceachel Susanna Benge, CNM, MSN 02/07/2015 1950

## 2015-02-08 ENCOUNTER — Encounter (HOSPITAL_COMMUNITY)
Admission: AD | Disposition: A | Discharge status: Home / Self Care | Payer: Self-pay | Source: Ambulatory Visit | Attending: Obstetrics and Gynecology

## 2015-02-08 ENCOUNTER — Inpatient Hospital Stay (HOSPITAL_COMMUNITY): Payer: 59 | Admitting: Anesthesiology

## 2015-02-08 ENCOUNTER — Encounter (HOSPITAL_COMMUNITY): Payer: Self-pay | Admitting: *Deleted

## 2015-02-08 ENCOUNTER — Inpatient Hospital Stay (HOSPITAL_COMMUNITY): Payer: 59

## 2015-02-08 DIAGNOSIS — O99214 Obesity complicating childbirth: Secondary | ICD-10-CM | POA: Diagnosis present

## 2015-02-08 DIAGNOSIS — IMO0001 Reserved for inherently not codable concepts without codable children: Secondary | ICD-10-CM

## 2015-02-08 DIAGNOSIS — R030 Elevated blood-pressure reading, without diagnosis of hypertension: Secondary | ICD-10-CM

## 2015-02-08 DIAGNOSIS — O34219 Maternal care for unspecified type scar from previous cesarean delivery: Secondary | ICD-10-CM | POA: Diagnosis present

## 2015-02-08 DIAGNOSIS — E669 Obesity, unspecified: Secondary | ICD-10-CM | POA: Diagnosis present

## 2015-02-08 DIAGNOSIS — Z3483 Encounter for supervision of other normal pregnancy, third trimester: Secondary | ICD-10-CM | POA: Diagnosis present

## 2015-02-08 DIAGNOSIS — Z6831 Body mass index (BMI) 31.0-31.9, adult: Secondary | ICD-10-CM | POA: Diagnosis not present

## 2015-02-08 DIAGNOSIS — D696 Thrombocytopenia, unspecified: Secondary | ICD-10-CM | POA: Diagnosis present

## 2015-02-08 DIAGNOSIS — O9912 Other diseases of the blood and blood-forming organs and certain disorders involving the immune mechanism complicating childbirth: Secondary | ICD-10-CM | POA: Diagnosis present

## 2015-02-08 DIAGNOSIS — O1410 Severe pre-eclampsia, unspecified trimester: Secondary | ICD-10-CM | POA: Diagnosis present

## 2015-02-08 DIAGNOSIS — O1414 Severe pre-eclampsia complicating childbirth: Secondary | ICD-10-CM | POA: Diagnosis present

## 2015-02-08 DIAGNOSIS — Z3A36 36 weeks gestation of pregnancy: Secondary | ICD-10-CM | POA: Diagnosis not present

## 2015-02-08 DIAGNOSIS — R0789 Other chest pain: Secondary | ICD-10-CM | POA: Diagnosis present

## 2015-02-08 DIAGNOSIS — Z87891 Personal history of nicotine dependence: Secondary | ICD-10-CM | POA: Diagnosis not present

## 2015-02-08 LAB — CBC
HCT: 34.3 % — ABNORMAL LOW (ref 36.0–46.0)
HEMATOCRIT: 30.7 % — AB (ref 36.0–46.0)
HEMOGLOBIN: 12.2 g/dL (ref 12.0–15.0)
Hemoglobin: 10.6 g/dL — ABNORMAL LOW (ref 12.0–15.0)
MCH: 31.6 pg (ref 26.0–34.0)
MCH: 32.4 pg (ref 26.0–34.0)
MCHC: 34.5 g/dL (ref 30.0–36.0)
MCHC: 35.6 g/dL (ref 30.0–36.0)
MCV: 91.6 fL (ref 78.0–100.0)
MCV: 91.2 fL (ref 78.0–100.0)
PLATELETS: 114 10*3/uL — AB (ref 150–400)
Platelets: 86 10*3/uL — ABNORMAL LOW (ref 150–400)
RBC: 3.35 MIL/uL — ABNORMAL LOW (ref 3.87–5.11)
RBC: 3.76 MIL/uL — AB (ref 3.87–5.11)
RDW: 13.4 % (ref 11.5–15.5)
RDW: 13.5 % (ref 11.5–15.5)
WBC: 10.4 10*3/uL (ref 4.0–10.5)
WBC: 15 10*3/uL — ABNORMAL HIGH (ref 4.0–10.5)

## 2015-02-08 LAB — LACTATE DEHYDROGENASE
LDH: 184 U/L (ref 98–192)
LDH: 292 U/L — AB (ref 98–192)

## 2015-02-08 LAB — TYPE AND SCREEN
ABO/RH(D): A POS
Antibody Screen: NEGATIVE

## 2015-02-08 LAB — COMPREHENSIVE METABOLIC PANEL
ALBUMIN: 2.4 g/dL — AB (ref 3.5–5.0)
ALBUMIN: 2.7 g/dL — AB (ref 3.5–5.0)
ALK PHOS: 98 U/L (ref 38–126)
ALT: 29 U/L (ref 14–54)
ALT: 32 U/L (ref 14–54)
ANION GAP: 6 (ref 5–15)
ANION GAP: 8 (ref 5–15)
AST: 42 U/L — AB (ref 15–41)
AST: 46 U/L — ABNORMAL HIGH (ref 15–41)
Alkaline Phosphatase: 87 U/L (ref 38–126)
BILIRUBIN TOTAL: 0.3 mg/dL (ref 0.3–1.2)
BUN: 13 mg/dL (ref 6–20)
BUN: 9 mg/dL (ref 6–20)
CALCIUM: 7.9 mg/dL — AB (ref 8.9–10.3)
CHLORIDE: 102 mmol/L (ref 101–111)
CO2: 21 mmol/L — AB (ref 22–32)
CO2: 23 mmol/L (ref 22–32)
Calcium: 7.8 mg/dL — ABNORMAL LOW (ref 8.9–10.3)
Chloride: 107 mmol/L (ref 101–111)
Creatinine, Ser: 0.63 mg/dL (ref 0.44–1.00)
Creatinine, Ser: 0.62 mg/dL (ref 0.44–1.00)
GFR calc Af Amer: >60 mL/min (ref >60)
GFR calc non Af Amer: >60 mL/min (ref >60)
GFR calc non Af Amer: >60 mL/min (ref >60)
GLUCOSE: 89 mg/dL (ref 65–99)
GLUCOSE: 134 mg/dL — AB (ref 65–99)
POTASSIUM: 3.6 mmol/L (ref 3.5–5.1)
Potassium: 4.6 mmol/L (ref 3.5–5.1)
SODIUM: 134 mmol/L — AB (ref 135–145)
SODIUM: 133 mmol/L — AB (ref 135–145)
TOTAL PROTEIN: 5.4 g/dL — AB (ref 6.5–8.1)
Total Bilirubin: 0.3 mg/dL (ref 0.3–1.2)
Total Protein: 6.3 g/dL — ABNORMAL LOW (ref 6.5–8.1)

## 2015-02-08 LAB — URIC ACID: Uric Acid, Serum: 6.7 mg/dL — ABNORMAL HIGH (ref 2.3–6.6)

## 2015-02-08 LAB — MAGNESIUM: Magnesium: 4.8 mg/dL — ABNORMAL HIGH (ref 1.7–2.4)

## 2015-02-08 SURGERY — Surgical Case
Anesthesia: Spinal | Site: Abdomen

## 2015-02-08 MED ORDER — MORPHINE SULFATE (PF) 0.5 MG/ML IJ SOLN
INTRAMUSCULAR | Status: DC | PRN
  Administered 2015-02-08: .2 mg via INTRATHECAL

## 2015-02-08 MED ORDER — SCOPOLAMINE 1 MG/3DAYS TD PT72
MEDICATED_PATCH | TRANSDERMAL | Status: DC | PRN
  Administered 2015-02-08: 1 via TRANSDERMAL

## 2015-02-08 MED ORDER — FENTANYL CITRATE (PF) 100 MCG/2ML IJ SOLN
INTRAMUSCULAR | Status: AC
  Filled 2015-02-08: qty 2

## 2015-02-08 MED ORDER — NALOXONE HCL 2 MG/2ML IJ SOSY
1.0000 ug/kg/h | PREFILLED_SYRINGE | INTRAVENOUS | Status: DC | PRN
  Filled 2015-02-08: qty 2

## 2015-02-08 MED ORDER — PHENYLEPHRINE 8 MG IN D5W 100 ML (0.08MG/ML) PREMIX OPTIME
INJECTION | INTRAVENOUS | Status: DC | PRN
  Administered 2015-02-08: 60 ug/min via INTRAVENOUS

## 2015-02-08 MED ORDER — PROPOFOL 10 MG/ML IV BOLUS
INTRAVENOUS | Status: AC
Start: 2015-02-08 — End: 2015-02-08
  Filled 2015-02-08: qty 20

## 2015-02-08 MED ORDER — ACETAMINOPHEN 325 MG PO TABS: 650.0000 mg | ORAL_TABLET | ORAL | Status: DC | PRN

## 2015-02-08 MED ORDER — NALBUPHINE HCL 10 MG/ML IJ SOLN: 5.0000 mg | INTRAMUSCULAR | Status: DC | PRN

## 2015-02-08 MED ORDER — MEPERIDINE HCL 25 MG/ML IJ SOLN
INTRAMUSCULAR | Status: DC | PRN
  Administered 2015-02-08 (×2): 12.5 mg via INTRAVENOUS

## 2015-02-08 MED ORDER — OXYCODONE-ACETAMINOPHEN 5-325 MG PO TABS: 2.0000 | ORAL_TABLET | ORAL | Status: DC | PRN

## 2015-02-08 MED ORDER — LACTATED RINGERS IV SOLN
2.0000 g/h | INTRAVENOUS | Status: DC
Start: 2015-02-08 — End: 2015-02-11
  Administered 2015-02-09: 2 g/h via INTRAVENOUS
  Filled 2015-02-08 (×2): qty 80

## 2015-02-08 MED ORDER — IBUPROFEN 600 MG PO TABS
600.0000 mg | ORAL_TABLET | Freq: Four times a day (QID) | ORAL | Status: DC
  Administered 2015-02-08 – 2015-02-11 (×13): 600 mg via ORAL
  Filled 2015-02-08 (×13): qty 1

## 2015-02-08 MED ORDER — CALCIUM CARBONATE ANTACID 500 MG PO CHEW
2.0000 | CHEWABLE_TABLET | ORAL | Status: DC | PRN
  Filled 2015-02-08: qty 2

## 2015-02-08 MED ORDER — DEXTROSE IN LACTATED RINGERS 5 % IV SOLN
INTRAVENOUS | Status: DC
  Administered 2015-02-08: 01:00:00 via INTRAVENOUS

## 2015-02-08 MED ORDER — PRENATAL MULTIVITAMIN CH
1.0000 | ORAL_TABLET | Freq: Every day | ORAL | Status: DC
  Filled 2015-02-08: qty 1

## 2015-02-08 MED ORDER — NALBUPHINE HCL 10 MG/ML IJ SOLN: 5.0000 mg | Freq: Once | INTRAMUSCULAR | Status: DC | PRN

## 2015-02-08 MED ORDER — ONDANSETRON HCL 4 MG/2ML IJ SOLN
INTRAMUSCULAR | Status: DC | PRN
  Administered 2015-02-08: 4 mg via INTRAVENOUS

## 2015-02-08 MED ORDER — ONDANSETRON HCL 4 MG/2ML IJ SOLN: 4.0000 mg | Freq: Three times a day (TID) | INTRAMUSCULAR | Status: DC | PRN

## 2015-02-08 MED ORDER — FERROUS SULFATE 325 (65 FE) MG PO TABS
325.0000 mg | ORAL_TABLET | Freq: Two times a day (BID) | ORAL | Status: DC
  Administered 2015-02-08 – 2015-02-11 (×6): 325 mg via ORAL
  Filled 2015-02-08 (×6): qty 1

## 2015-02-08 MED ORDER — SCOPOLAMINE 1 MG/3DAYS TD PT72
1.0000 | MEDICATED_PATCH | Freq: Once | TRANSDERMAL | Status: DC
  Filled 2015-02-08: qty 1

## 2015-02-08 MED ORDER — LANOLIN HYDROUS EX OINT: 1.0000 "application " | TOPICAL_OINTMENT | CUTANEOUS | Status: DC | PRN

## 2015-02-08 MED ORDER — SIMETHICONE 80 MG PO CHEW
80.0000 mg | CHEWABLE_TABLET | Freq: Three times a day (TID) | ORAL | Status: DC
  Administered 2015-02-08 – 2015-02-10 (×8): 80 mg via ORAL
  Filled 2015-02-08 (×8): qty 1

## 2015-02-08 MED ORDER — OXYCODONE-ACETAMINOPHEN 5-325 MG PO TABS
1.0000 | ORAL_TABLET | ORAL | Status: DC | PRN
  Administered 2015-02-08: 2 via ORAL
  Filled 2015-02-08: qty 2

## 2015-02-08 MED ORDER — ONDANSETRON HCL 4 MG/2ML IJ SOLN
INTRAMUSCULAR | Status: AC
  Filled 2015-02-08: qty 2

## 2015-02-08 MED ORDER — LACTATED RINGERS IV SOLN
INTRAVENOUS | Status: DC
Start: 2015-02-08 — End: 2015-02-11

## 2015-02-08 MED ORDER — CEFAZOLIN SODIUM-DEXTROSE 2-3 GM-% IV SOLR: 2.0000 g | Freq: Once | INTRAVENOUS | Status: DC

## 2015-02-08 MED ORDER — LACTATED RINGERS IV SOLN
INTRAVENOUS | Status: DC
  Administered 2015-02-08: 08:00:00 via INTRAVENOUS

## 2015-02-08 MED ORDER — FENTANYL CITRATE (PF) 250 MCG/5ML IJ SOLN
INTRAMUSCULAR | Status: AC
  Filled 2015-02-08: qty 5

## 2015-02-08 MED ORDER — CITRIC ACID-SODIUM CITRATE 334-500 MG/5ML PO SOLN
ORAL | Status: AC
  Administered 2015-02-08: 30 mL
  Filled 2015-02-08: qty 15

## 2015-02-08 MED ORDER — SODIUM CHLORIDE 0.9 % IJ SOLN: 3.0000 mL | INTRAMUSCULAR | Status: DC | PRN

## 2015-02-08 MED ORDER — MEPERIDINE HCL 25 MG/ML IJ SOLN
INTRAMUSCULAR | Status: AC
  Filled 2015-02-08: qty 1

## 2015-02-08 MED ORDER — DIBUCAINE 1 % RE OINT: 1.0000 "application " | TOPICAL_OINTMENT | RECTAL | Status: DC | PRN

## 2015-02-08 MED ORDER — SIMETHICONE 80 MG PO CHEW: 80.0000 mg | CHEWABLE_TABLET | ORAL | Status: DC | PRN

## 2015-02-08 MED ORDER — OXYTOCIN 10 UNIT/ML IJ SOLN
40.0000 [IU] | INTRAVENOUS | Status: DC | PRN
  Administered 2015-02-08: 40 [IU] via INTRAVENOUS

## 2015-02-08 MED ORDER — LACTATED RINGERS IV SOLN
INTRAVENOUS | Status: DC | PRN
  Administered 2015-02-08: 08:00:00 via INTRAVENOUS

## 2015-02-08 MED ORDER — DEXAMETHASONE SODIUM PHOSPHATE 4 MG/ML IJ SOLN
INTRAMUSCULAR | Status: AC
  Filled 2015-02-08: qty 1

## 2015-02-08 MED ORDER — DIPHENHYDRAMINE HCL 25 MG PO CAPS: 25.0000 mg | ORAL_CAPSULE | ORAL | Status: DC | PRN

## 2015-02-08 MED ORDER — OXYCODONE-ACETAMINOPHEN 5-325 MG PO TABS
1.0000 | ORAL_TABLET | ORAL | Status: DC | PRN
  Administered 2015-02-09 – 2015-02-11 (×7): 1 via ORAL
  Filled 2015-02-08 (×7): qty 1

## 2015-02-08 MED ORDER — PHENYLEPHRINE 8 MG IN D5W 100 ML (0.08MG/ML) PREMIX OPTIME
INJECTION | INTRAVENOUS | Status: AC
  Filled 2015-02-08: qty 100

## 2015-02-08 MED ORDER — DIPHENHYDRAMINE HCL 25 MG PO CAPS: 25.0000 mg | ORAL_CAPSULE | Freq: Four times a day (QID) | ORAL | Status: DC | PRN

## 2015-02-08 MED ORDER — BETAMETHASONE SOD PHOS & ACET 6 (3-3) MG/ML IJ SUSP
12.0000 mg | Freq: Once | INTRAMUSCULAR | Status: AC
  Administered 2015-02-08: 12 mg via INTRAMUSCULAR
  Filled 2015-02-08: qty 2

## 2015-02-08 MED ORDER — MENTHOL 3 MG MT LOZG
1.0000 | LOZENGE | OROMUCOSAL | Status: DC | PRN
  Filled 2015-02-08: qty 9

## 2015-02-08 MED ORDER — WITCH HAZEL-GLYCERIN EX PADS: 1.0000 "application " | MEDICATED_PAD | CUTANEOUS | Status: DC | PRN

## 2015-02-08 MED ORDER — FENTANYL CITRATE (PF) 100 MCG/2ML IJ SOLN
INTRAMUSCULAR | Status: DC | PRN
  Administered 2015-02-08: 10 ug via INTRAVENOUS

## 2015-02-08 MED ORDER — OXYTOCIN 40 UNITS IN LACTATED RINGERS INFUSION - SIMPLE MED: 62.5000 mL/h | INTRAVENOUS | Status: AC

## 2015-02-08 MED ORDER — FENTANYL CITRATE (PF) 100 MCG/2ML IJ SOLN
25.0000 ug | INTRAMUSCULAR | Status: DC | PRN
  Administered 2015-02-08: 50 ug via INTRAVENOUS

## 2015-02-08 MED ORDER — OXYTOCIN 10 UNIT/ML IJ SOLN
INTRAMUSCULAR | Status: AC
  Filled 2015-02-08: qty 1

## 2015-02-08 MED ORDER — DEXTROSE IN LACTATED RINGERS 5 % IV SOLN: INTRAVENOUS | Status: DC

## 2015-02-08 MED ORDER — BUPIVACAINE IN DEXTROSE 0.75-8.25 % IT SOLN
INTRATHECAL | Status: DC | PRN
  Administered 2015-02-08: 1.6 mL via INTRATHECAL

## 2015-02-08 MED ORDER — PRENATAL MULTIVITAMIN CH
1.0000 | ORAL_TABLET | Freq: Every day | ORAL | Status: DC
  Administered 2015-02-08 – 2015-02-10 (×3): 1 via ORAL
  Filled 2015-02-08 (×3): qty 1

## 2015-02-08 MED ORDER — DOCUSATE SODIUM 100 MG PO CAPS
100.0000 mg | ORAL_CAPSULE | Freq: Every day | ORAL | Status: DC
  Filled 2015-02-08: qty 1

## 2015-02-08 MED ORDER — ZOLPIDEM TARTRATE 5 MG PO TABS
5.0000 mg | ORAL_TABLET | Freq: Every evening | ORAL | Status: DC | PRN
  Administered 2015-02-08: 5 mg via ORAL
  Filled 2015-02-08: qty 1

## 2015-02-08 MED ORDER — DEXAMETHASONE SODIUM PHOSPHATE 4 MG/ML IJ SOLN
INTRAMUSCULAR | Status: DC | PRN
  Administered 2015-02-08: 4 mg via INTRAVENOUS

## 2015-02-08 MED ORDER — MORPHINE SULFATE (PF) 0.5 MG/ML IJ SOLN
INTRAMUSCULAR | Status: AC
  Filled 2015-02-08: qty 10

## 2015-02-08 MED ORDER — SCOPOLAMINE 1 MG/3DAYS TD PT72
MEDICATED_PATCH | TRANSDERMAL | Status: AC
  Filled 2015-02-08: qty 1

## 2015-02-08 MED ORDER — OXYTOCIN 10 UNIT/ML IJ SOLN
INTRAMUSCULAR | Status: AC
  Filled 2015-02-08: qty 4

## 2015-02-08 MED ORDER — ZOLPIDEM TARTRATE 5 MG PO TABS: 5.0000 mg | ORAL_TABLET | Freq: Every evening | ORAL | Status: DC | PRN

## 2015-02-08 MED ORDER — CEFAZOLIN SODIUM-DEXTROSE 2-3 GM-% IV SOLR
INTRAVENOUS | Status: DC | PRN
  Administered 2015-02-08: 2 g via INTRAVENOUS

## 2015-02-08 MED ORDER — SIMETHICONE 80 MG PO CHEW
80.0000 mg | CHEWABLE_TABLET | ORAL | Status: DC
  Administered 2015-02-09 – 2015-02-10 (×3): 80 mg via ORAL
  Filled 2015-02-08 (×3): qty 1

## 2015-02-08 MED ORDER — SENNOSIDES-DOCUSATE SODIUM 8.6-50 MG PO TABS
2.0000 | ORAL_TABLET | ORAL | Status: DC
  Administered 2015-02-09 (×2): 2 via ORAL
  Filled 2015-02-08 (×2): qty 2

## 2015-02-08 MED ORDER — NALOXONE HCL 0.4 MG/ML IJ SOLN: 0.4000 mg | INTRAMUSCULAR | Status: DC | PRN

## 2015-02-08 MED ORDER — DIPHENHYDRAMINE HCL 50 MG/ML IJ SOLN: 12.5000 mg | INTRAMUSCULAR | Status: DC | PRN

## 2015-02-08 MED ORDER — MAGNESIUM SULFATE BOLUS VIA INFUSION
4.0000 g | Freq: Once | INTRAVENOUS | Status: AC
  Administered 2015-02-08: 4 g via INTRAVENOUS
  Filled 2015-02-08: qty 500

## 2015-02-08 SURGICAL SUPPLY — 42 items
APL SKNCLS STERI-STRIP NONHPOA (GAUZE/BANDAGES/DRESSINGS) ×1
BARRIER ADHS 3X4 INTERCEED (GAUZE/BANDAGES/DRESSINGS) ×2 IMPLANT
BENZOIN TINCTURE PRP APPL 2/3 (GAUZE/BANDAGES/DRESSINGS) ×3 IMPLANT
BRR ADH 4X3 ABS CNTRL BYND (GAUZE/BANDAGES/DRESSINGS) ×1
CLAMP CORD UMBIL (MISCELLANEOUS) IMPLANT
CLOSURE WOUND 1/2 X4 (GAUZE/BANDAGES/DRESSINGS) ×1
CLOTH BEACON ORANGE TIMEOUT ST (SAFETY) ×3 IMPLANT
CONTAINER PREFILL 10% NBF 15ML (MISCELLANEOUS) IMPLANT
DRAPE SHEET LG 3/4 BI-LAMINATE (DRAPES) IMPLANT
DRSG OPSITE POSTOP 4X10 (GAUZE/BANDAGES/DRESSINGS) ×3 IMPLANT
DURAPREP 26ML APPLICATOR (WOUND CARE) ×3 IMPLANT
ELECT REM PT RETURN 9FT ADLT (ELECTROSURGICAL) ×3
ELECTRODE REM PT RTRN 9FT ADLT (ELECTROSURGICAL) ×1 IMPLANT
EXTRACTOR VACUUM M CUP 4 TUBE (SUCTIONS) IMPLANT
EXTRACTOR VACUUM M CUP 4' TUBE (SUCTIONS)
GLOVE BIOGEL M 6.5 STRL (GLOVE) ×6 IMPLANT
GLOVE BIOGEL PI IND STRL 6.5 (GLOVE) ×1 IMPLANT
GLOVE BIOGEL PI IND STRL 7.0 (GLOVE) ×1 IMPLANT
GLOVE BIOGEL PI INDICATOR 6.5 (GLOVE) ×2
GLOVE BIOGEL PI INDICATOR 7.0 (GLOVE) ×2
GOWN STRL REUS W/TWL LRG LVL3 (GOWN DISPOSABLE) ×9 IMPLANT
KIT ABG SYR 3ML LUER SLIP (SYRINGE) IMPLANT
NDL HYPO 25X5/8 SAFETYGLIDE (NEEDLE) IMPLANT
NEEDLE HYPO 25X5/8 SAFETYGLIDE (NEEDLE) IMPLANT
NS IRRIG 1000ML POUR BTL (IV SOLUTION) ×3 IMPLANT
PACK C SECTION WH (CUSTOM PROCEDURE TRAY) ×3 IMPLANT
PAD OB MATERNITY 4.3X12.25 (PERSONAL CARE ITEMS) ×3 IMPLANT
PENCIL SMOKE EVAC W/HOLSTER (ELECTROSURGICAL) ×3 IMPLANT
RTRCTR C-SECT PINK 25CM LRG (MISCELLANEOUS) IMPLANT
RTRCTR C-SECT PINK 34CM XLRG (MISCELLANEOUS) IMPLANT
STRIP CLOSURE SKIN 1/2X4 (GAUZE/BANDAGES/DRESSINGS) ×2 IMPLANT
SUT PDS AB 0 CT1 27 (SUTURE) ×6 IMPLANT
SUT PLAIN 0 NONE (SUTURE) IMPLANT
SUT VIC AB 0 CTX 36 (SUTURE) ×9
SUT VIC AB 0 CTX36XBRD ANBCTRL (SUTURE) ×3 IMPLANT
SUT VIC AB 2-0 CT1 27 (SUTURE) ×12
SUT VIC AB 2-0 CT1 TAPERPNT 27 (SUTURE) ×1 IMPLANT
SUT VIC AB 3-0 SH 27 (SUTURE)
SUT VIC AB 3-0 SH 27X BRD (SUTURE) IMPLANT
SUT VIC AB 4-0 KS 27 (SUTURE) ×3 IMPLANT
TOWEL OR 17X24 6PK STRL BLUE (TOWEL DISPOSABLE) ×3 IMPLANT
TRAY FOLEY CATH SILVER 14FR (SET/KITS/TRAYS/PACK) ×3 IMPLANT

## 2015-02-08 NOTE — Progress Notes (Signed)
Pt states that pain intensifies with standing and sitting in high-fowlers. Pt assisted to left lateral position and reports a decrease in discomfort.

## 2015-02-08 NOTE — Transfer of Care (Signed)
Immediate Anesthesia Transfer of Care Note  Patient: Melinda BoozeJessica S Monty  Procedure(s) Performed: Procedure(s): CESAREAN SECTION (N/A)  Patient Location: PACU  Anesthesia Type:Spinal  Level of Consciousness: awake, alert  and oriented  Airway & Oxygen Therapy: Patient Spontanous Breathing  Post-op Assessment: Report given to RN and Post -op Vital signs reviewed and stable  Post vital signs: Reviewed and stable  Last Vitals:  Filed Vitals:   02/08/15 0402 02/08/15 0730  BP: 108/41 100/77  Pulse: 53 64  Temp:  36.6 C  Resp:  18    Complications: No apparent anesthesia complications

## 2015-02-08 NOTE — H&P (Signed)
Melinda [name] is a 34 y.o. female presenting for  to the MAU for BP evaluation and lower sternal pain that radiates to mid back that has been increasing throughout the day. Pt recently was seen on 01/31/15 for lower sternal pain. Pain has been managed by tylenol throughout the week. Pt reports pain increasing today unrelieved by Tylenol. Reports +FM, Denies contractions, Lof, vb, HA, or visual disturbances.  Pt states she last ate a [employer] sandwich at 2030 pm.  . History OB History    Gravida Para Term Preterm AB TAB SAB Ectopic Multiple Living   Past Medical History  Diagnosis Date  . Vaginal Pap smear, abnormal     ASCUS  . Hypertension     Gestational HTN - Postpartum Mag   Past Surgical History  Procedure Laterality Date  . Breast surgery      implants  . Cesarean section N/A 08/20/2013    Procedure: CESAREAN SECTION;  Surgeon: Dorien Chihuahua. Richardson Dopp, MD;  Location: [facility];  Service: Obstetrics;  Laterality: N/A;   Family History: family history includes Alcohol abuse in her father; Cancer in her maternal grandmother and paternal grandmother; Varicose Veins in her maternal grandmother. There is no history of Arthritis, Asthma, Birth defects, COPD, Depression, Diabetes, Drug abuse, Early death, Hearing loss, Heart disease, Hyperlipidemia, Hypertension, Kidney disease, Learning disabilities, Mental illness, Mental retardation, Miscarriages / Stillbirths, Stroke, or Vision loss. Social History:  reports that she quit smoking about 5 years ago. She has never used smokeless tobacco. She reports that she does not drink alcohol or use illicit drugs.   Prenatal Transfer Tool  Maternal Diabetes: No Genetic Screening: Normal Maternal Ultrasounds/Referrals: Normal Fetal Ultrasounds or other Referrals:  None Maternal Substance Abuse:  No Significant Maternal Medications:  None Significant Maternal Lab Results:  None Other Comments:  None  Review of Systems   Constitutional: Negative.   HENT: Negative.   Eyes: Negative.   Respiratory: Negative.   Cardiovascular: Negative.   Gastrointestinal: Negative.   Genitourinary: Negative.   Musculoskeletal:        Lower sternal pain that radiates to her back, Denies abdominal pain, denies rebound pain.   Skin: Negative.   Neurological: Negative.   Endo/Heme/Allergies: Negative.   Psychiatric/Behavioral: Negative.       Blood pressure 132/69, pulse 64, temperature 98.6 F (37 C), temperature source Oral, resp. rate 18, height  (1.702 m), weight 91.627 kg (202 lb), SpO2 100 %, unknown if currently breastfeeding. Maternal Exam:  Uterine Assessment: Contraction frequency is rare.   Abdomen: Tenderness over Zyphoid process and lower sternal area. No rebound, no abdominal pain  Introitus: not evaluated.   Cervix: not evaluated.   Physical Exam  Constitutional: She is oriented to person, place, and time. She appears well-developed and well-nourished.  HENT:  Head: Normocephalic and atraumatic.  Eyes: Conjunctivae and EOM are normal. Pupils are equal, round, and reactive to light.  Neck: Normal range of motion. Neck supple.  Cardiovascular: Normal rate, regular rhythm and normal heart sounds.   Respiratory: Effort normal and breath sounds normal.  GI: Soft. Bowel sounds are normal. There is tenderness.  Musculoskeletal: Normal range of motion.  Neurological: She is alert and oriented to person, place, and time. She has normal reflexes.  Skin: Skin is warm and dry.  Psychiatric: She has a normal mood and affect. Her behavior is normal. Judgment and thought content normal.  Filed Vitals:   02/07/15 2324 02/08/15 0000 02/08/15 0001 02/08/15 0016  BP: 142/84 142/87 145/83 132/69  Pulse:      Temp:      TempSrc:      Resp:      Height:      Weight:      SpO2:        Results for orders placed or performed during the hospital encounter of 02/07/15 (from the past 24 hour(s))   Urinalysis, Routine w reflex microscopic (not at [facility])     Status: None   Collection Time: 02/07/15  9:04 PM  Result Value Ref Range   Color, Urine YELLOW YELLOW   APPearance CLEAR CLEAR   Specific Gravity, Urine 1.025 1.005 - 1.030   pH 6.0 5.0 - 8.0   Glucose, UA NEGATIVE NEGATIVE mg/dL   Hgb urine dipstick NEGATIVE NEGATIVE   Bilirubin Urine NEGATIVE NEGATIVE   Ketones, ur NEGATIVE NEGATIVE mg/dL   Protein, ur NEGATIVE NEGATIVE mg/dL   Nitrite NEGATIVE NEGATIVE   Leukocytes, UA NEGATIVE NEGATIVE  Protein / creatinine ratio, urine     Status: None   Collection Time: 02/07/15  9:04 PM  Result Value Ref Range   Creatinine, Urine 161.00 mg/dL   Total Protein, Urine 14 mg/dL   Protein Creatinine Ratio 0.09 0.00 - 0.15 mg/mg[Cre]  CBC     Status: Abnormal   Collection Time: 02/07/15 10:35 PM  Result Value Ref Range   WBC 13.1 (H) 4.0 - 10.5 K/uL   RBC 3.60 (L) 3.87 - 5.11 MIL/uL   Hemoglobin 11.4 (L) 12.0 - 15.0 g/dL   HCT 16.1 (L) 09.6 - 04.5 %   MCV 91.7 78.0 - 100.0 fL   MCH 31.7 26.0 - 34.0 pg   MCHC 34.5 30.0 - 36.0 g/dL   RDW 40.9 81.1 - 91.4 %   Platelets 109 (L) 150 - 400 K/uL  Comprehensive metabolic panel     Status: Abnormal   Collection Time: 02/07/15 10:35 PM  Result Value Ref Range   Sodium 135 135 - 145 mmol/L   Potassium 4.2 3.5 - 5.1 mmol/L   Chloride 105 101 - 111 mmol/L   CO2 24 22 - 32 mmol/L   Glucose, Bld 99 65 - 99 mg/dL   BUN 16 6 - 20 mg/dL   Creatinine, Ser 7.82 0.44 - 1.00 mg/dL   Calcium 8.5 (L) 8.9 - 10.3 mg/dL   Total Protein 5.9 (L) 6.5 - 8.1 g/dL   Albumin 2.8 (L) 3.5 - 5.0 g/dL   AST 42 (H) 15 - 41 U/L   ALT 31 14 - 54 U/L   Alkaline Phosphatase 105 38 - 126 U/L   Total Bilirubin 0.4 0.3 - 1.2 mg/dL   GFR calc non Af Amer >60 >60 mL/min   GFR calc Af Amer >60 >60 mL/min   Anion gap 6 5 - 15  Lactate dehydrogenase     Status: Abnormal   Collection Time: 02/07/15 10:35 PM  Result Value Ref Range   LDH 207 (H) 98 - 192 U/L  Uric  acid     Status: Abnormal   Collection Time: 02/07/15 10:35 PM  Result Value Ref Range   Uric Acid, Serum 6.9 (H) 2.3 - 6.6 mg/dL     Prenatal labs: ABO, Rh:  A, postive Antibody:   Negative  07/14/14 Rubella:   immune RPR:    NR 12/07/14 HBsAg:   Negative   07/14/14 HIV:    Negative 07/14/14 GBS:  unknown   Assessment: IUP 36.3 wks Lower Sternal pain - suspected Cholecystasis Elevated BP - suspected pre-eclampsia  Platelets 109 Hx Pre-clampsia in previous pregnancy Hx previous C/s for pre-eclampsia  Plan: Admit for 23 hrs observation Continuous Fetal monitoring Repeat PIH labs in AM NPO now D5LR US Abdominal Complete in AM PO Percocet PRN, may have sips of water  Consult with Dr. Normand Sloopillard, aware and agrees with plan    Alphonzo Severanceachel Stall 02/08/2015, 12:25 AM

## 2015-02-08 NOTE — Anesthesia Postprocedure Evaluation (Signed)
Anesthesia Post Note  Patient: Melinda [name]  Procedure(s) Performed: Procedure(s) (LRB): CESAREAN SECTION (N/A)  Patient location during evaluation: PACU Anesthesia Type: Spinal Level of consciousness: awake and alert Pain management: pain level controlled Vital Signs Assessment: post-procedure vital signs reviewed and stable Respiratory status: spontaneous breathing, nonlabored ventilation, respiratory function stable and patient connected to nasal cannula oxygen Cardiovascular status: blood pressure returned to baseline and stable Postop Assessment: no signs of nausea or vomiting, spinal receding and patient able to bend at knees Anesthetic complications: no    Last Vitals:  Filed Vitals:   02/08/15 1104 02/08/15 1200  BP: 121/74 118/66  Pulse: 64 50  Temp: 36.5 C   Resp: 16     Last Pain:  Filed Vitals:   02/08/15 1214  PainSc: 2                  Andreina Outten JENNETTE

## 2015-02-08 NOTE — Lactation Note (Signed)
This note was copied from the chart of Melinda [name]. Lactation Consultation Note  Initial visit made.  Breastfeeding consultation services and support information given to patient.  Baby is 8 hours old and 36.4 weeks.  Discussed with mom late preterm behavior and LPT handout given and reviewed with patient.  She has a 6618 month old daughter that she exclusively pumped and bottle fed for 9 months because baby would not latch.  She is hoping this baby will latch and breastfeed.  Mom has had baby to breast every 2-3 hours since birth.  She states baby will latch briefly.  She is pumping and hand expressing every 3 hours.  Baby has had one formula syringe feeding recently.  Reviewed volume parameters.  Mom has a good understanding of plan.  Encouraged to call with concerns/latch assist.  Patient Name: Melinda [name] ZOXWR'UToday's Date: 02/08/2015 Reason for consult: Initial assessment;Late preterm infant;Breast surgery (BREAST IMPLANTS)   Maternal Data    Feeding Feeding Type: Formula  LATCH Score/Interventions                      Lactation Tools Discussed/Used WIC Program: No Initiated by:: PATIENT Date initiated:: 02/08/15   Consult Status Consult Status: Follow-up Date: 02/09/15 Follow-up type: In-patient    Huston FoleyMOULDEN, Adrianna Dudas S 02/08/2015, 5:45 PM

## 2015-02-08 NOTE — Op Note (Signed)
Cesarean Section Procedure Note  Indications: Preeclampsia with severe features   Pre-operative Diagnosis: 36 week 4 day pregnancy.  Post-operative Diagnosis: same  Surgeon: Jessee AversOLE,Salem Lembke J.   Assistants: Dr. Myna HidalgoJennifer Ozan   Anesthesia: Spinal anesthesia  ASA Class: 2   Procedure Details   The patient was seen in the Holding Room. The risks, benefits, complications, treatment options, and expected outcomes were discussed with the patient.  The patient concurred with the proposed plan, giving informed consent.  The site of surgery properly noted/marked. The patient was taken to Operating Room # 1, identified as Jettie BoozeJessica S Estock and the procedure verified as C-Section Delivery. A Time Out was held and the above information confirmed.  After induction of anesthesia, the patient was draped and prepped in the usual sterile manner. A Pfannenstiel incision was made and carried down through the subcutaneous tissue to the fascia. Fascial incision was made and extended transversely. The fascia was separated from the underlying rectus tissue superiorly and inferiorly. The peritoneum was identified and entered. Peritoneal incision was extended longitudinally. The utero-vesical peritoneal reflection was incised transversely and the bladder flap was bluntly freed from the lower uterine segment. A low transverse uterine incision was made. Delivered from cephalic presentation was a   Female with Apgar scores of 8 at one minute and 8 at five minutes. After the umbilical cord was clamped and cut cord blood was obtained for evaluation. The placenta was removed intact and appeared normal. The uterine outline, tubes and ovaries appeared normal. The uterine incision was closed with running locked sutures of o vicryl in a running locked fashion . Hemostasis was observed. Lavage was carried out until clear.  Interseed was placed along the hysterotomy incision. The fascia was then reapproximated with running sutures of 0  pds.  Scarpas fascia was reapproximated with 2-0 plain gut. The skin was reapproximated with 4-0 vicryl .  Instrument, sponge, and needle counts were correct prior the abdominal closure and at the conclusion of the case.   Findings: Female infant in cephalic presentation. Clear amniotic fluid. Normal appearing fallopian tubes and ovaries.   Estimated Blood Loss:  600 mL         Drains: Foley catheter         Total IV Fluids:  Per anesthesia ml         Specimens: Placenta sent to pathology           Implants: NA         Complications:  None; patient tolerated the procedure well.         Disposition: PACU - hemodynamically stable.         Condition: stable  Attending Attestation: I performed the procedure.

## 2015-02-08 NOTE — Anesthesia Postprocedure Evaluation (Signed)
Anesthesia Post Note  Patient: Melinda BoozeJessica S [name]  Procedure(s) Performed: Procedure(s) (LRB): CESAREAN SECTION (N/A)  Patient location during evaluation: Mother Baby Anesthesia Type: Spinal Level of consciousness: awake Pain management: satisfactory to patient Vital Signs Assessment: post-procedure vital signs reviewed and stable Respiratory status: spontaneous breathing Cardiovascular status: stable Anesthetic complications: no    Last Vitals:  Filed Vitals:   02/08/15 1500 02/08/15 1600  BP: 143/77 150/82  Pulse: 72 67  Temp:  36.8 C  Resp: 18 18    Last Pain:  Filed Vitals:   02/08/15 1621  PainSc: 2                  Jagar Lua

## 2015-02-08 NOTE — Progress Notes (Signed)
RN to bedside, pt assisted to BR. Cat I FHR surveillance prior to removal of EFM.    

## 2015-02-08 NOTE — Anesthesia Procedure Notes (Signed)
Spinal Patient location during procedure: OR Staffing Anesthesiologist: Milton Sagona Performed by: anesthesiologist  Preanesthetic Checklist Completed: patient identified, site marked, surgical consent, pre-op evaluation, timeout performed, IV checked, risks and benefits discussed and monitors and equipment checked Spinal Block Patient position: sitting Prep: DuraPrep Patient monitoring: continuous pulse ox, blood pressure and heart rate Approach: midline Location: L3-4 Injection technique: single-shot Needle Needle type: Sprotte  Needle gauge: 24 G Needle length: 9 cm Additional Notes Functioning IV was confirmed and monitors were applied. Sterile prep and drape, including hand hygiene, mask and sterile gloves were used. The patient was positioned and the spine was prepped. The skin was anesthetized with lidocaine.  Free flow of clear CSF was obtained prior to injecting local anesthetic into the CSF.  The spinal needle aspirated freely following injection.  The needle was carefully withdrawn.  The patient tolerated the procedure well. Consent was obtained prior to procedure with all questions answered and concerns addressed. Risks including but not limited to bleeding, infection, nerve damage, paralysis, failed block, inadequate analgesia, allergic reaction, high spinal, itching and headache were discussed and the patient wished to proceed.   Karie SchwalbeMary Aveah Castell, MD  This was a very easy, single pass spinal placement. No excess bleeding noted at the site. Precautions given to patient

## 2015-02-08 NOTE — Progress Notes (Signed)
Report given and care relinquished L. Lucianne MussLima, Charity fundraiserN.

## 2015-02-08 NOTE — Anesthesia Preprocedure Evaluation (Signed)
Anesthesia Evaluation  Patient identified by MRN, date of birth, ID band Patient awake    Reviewed: Allergy & Precautions, NPO status , Patient's Chart, lab work & pertinent test results  History of Anesthesia Complications Negative for: history of anesthetic complications  Airway Mallampati: II  TM Distance: >3 FB Neck ROM: Full    Dental no notable dental hx. (+) Dental Advisory Given   Pulmonary former smoker,    Pulmonary exam normal breath sounds clear to auscultation       Cardiovascular hypertension (PreE with severe features), Normal cardiovascular exam Rhythm:Regular Rate:Normal     Neuro/Psych negative neurological ROS  negative psych ROS   GI/Hepatic negative GI ROS, Neg liver ROS,   Endo/Other  obesity  Renal/GU negative Renal ROS  negative genitourinary   Musculoskeletal negative musculoskeletal ROS (+)   Abdominal   Peds negative pediatric ROS (+)  Hematology negative hematology ROS (+)   Anesthesia Other Findings   Reproductive/Obstetrics (+) Pregnancy                             Anesthesia Physical Anesthesia Plan  ASA: III and emergent  Anesthesia Plan: Spinal   Post-op Pain Management:    Induction:   Airway Management Planned:   Additional Equipment:   Intra-op Plan:   Post-operative Plan:   Informed Consent: I have reviewed the patients History and Physical, chart, labs and discussed the procedure including the risks, benefits and alternatives for the proposed anesthesia with the patient or authorized representative who has indicated his/her understanding and acceptance.   Dental advisory given  Plan Discussed with: CRNA  Anesthesia Plan Comments: (Plt count less than 2 hours prior is 86. Discussed r/b/o with patient and she desires to proceed with spinal anesthetic. Discussed increased risk of bleeding but feel it is acceptable to proceed with a  spinal. Will continue to monitor post op)        Anesthesia Quick Evaluation

## 2015-02-08 NOTE — Addendum Note (Signed)
Addendum  created 02/08/15 1639 by Algis GreenhouseLinda A Thoms Barthelemy, CRNA   Modules edited: Clinical Notes   Clinical Notes:  File: [id number]

## 2015-02-08 NOTE — Progress Notes (Signed)
Jettie BoozeJessica S Lyter is a 34 y.o. G3P1011 at 32100w4d  Admitted for observation due to elevated bp   Subjective: Patient has continued epigastric pain worse with standing better with lying.  She denies headache blurred vision .   Objective: BP 100/77 mmHg  Pulse 64  Temp(Src) 97.9 F (36.6 C) (Oral)  Resp 18  Ht 5\' 7"  (1.702 m)  Wt 91.627 kg (202 lb)  BMI 31.63 kg/m2  SpO2 100%      General alert and oriented no acute distress  CV rrr Lungs clear to ascultation bilaterally  Abdomen gravid nontender  Extremities trace edema 2+ reflexes bilaterally   FHT:  FHR: 120 bpm, variability: moderate,  accelerations:  Present,  decelerations:  Absent UC:   none  Labs: Lab Results  Component Value Date   WBC 10.4 02/08/2015   HGB 10.6* 02/08/2015   HCT 30.7* 02/08/2015   MCV 91.6 02/08/2015   PLT 86* 02/08/2015    Assessment / Plan: 36 wks and 4 days with preeclampsia with severe features based on blood pressure and thrombocytopenia ( platelets 86 / persistent epigastic pain  History of cesarean section recommend repeat cesarean section based on the above remote from delivery.  R/b/a of cesarean section discussed with the patient including but not limited to infection/ bleeding / damage to bowel bladder baby with the need for further surgery. R/o transfusion HIV/ Hepatitis B&C discussed. Pt voiced understanding and desires to proceed with repeat cesarean section    Arsh Feutz J. 02/08/2015, 7:34 AM

## 2015-02-08 NOTE — Progress Notes (Signed)
Pt to 160 safely via w/c from MAU. Admitting complaint: "Pain in sternum and high blood pressure". Pt denies contractions, SROM, vaginal bleeding and decreased fetal movement. Pt assisted to BR and instructed to void. Pt to bed and EFM applied to soft abd. Pt reports PIH with previous pregnancy. Orders reviewed and plan of care discussed to observe during the night, monitor fetal and maternal well-being, treat pain, and maintain NPO status for ultrasound to be performed on day shift.

## 2015-02-09 ENCOUNTER — Encounter (HOSPITAL_COMMUNITY): Payer: Self-pay | Admitting: Obstetrics and Gynecology

## 2015-02-09 ENCOUNTER — Inpatient Hospital Stay (HOSPITAL_COMMUNITY): Admission: RE | Admit: 2015-02-09 | Payer: Self-pay | Source: Ambulatory Visit

## 2015-02-09 LAB — CBC WITH DIFFERENTIAL/PLATELET
BASOS ABS: 0 10*3/uL (ref 0.0–0.1)
BASOS PCT: 0 %
Eosinophils Absolute: 0 10*3/uL (ref 0.0–0.7)
Eosinophils Relative: 0 %
HEMATOCRIT: 32.2 % — AB (ref 36.0–46.0)
HEMOGLOBIN: 11.2 g/dL — AB (ref 12.0–15.0)
Lymphocytes Relative: 10 %
Lymphs Abs: 1.6 10*3/uL (ref 0.7–4.0)
MCH: 32.2 pg (ref 26.0–34.0)
MCHC: 34.8 g/dL (ref 30.0–36.0)
MCV: 92.5 fL (ref 78.0–100.0)
MONOS PCT: 9 %
Monocytes Absolute: 1.4 10*3/uL — ABNORMAL HIGH (ref 0.1–1.0)
NEUTROS ABS: 13.1 10*3/uL — AB (ref 1.7–7.7)
NEUTROS PCT: 81 %
Platelets: 143 10*3/uL — ABNORMAL LOW (ref 150–400)
RBC: 3.48 MIL/uL — AB (ref 3.87–5.11)
RDW: 13.6 % (ref 11.5–15.5)
WBC: 16.1 10*3/uL — AB (ref 4.0–10.5)

## 2015-02-09 LAB — COMPREHENSIVE METABOLIC PANEL
ALBUMIN: 2.6 g/dL — AB (ref 3.5–5.0)
ALK PHOS: 88 U/L (ref 38–126)
ALT: 33 U/L (ref 14–54)
AST: 37 U/L (ref 15–41)
Anion gap: 6 (ref 5–15)
BILIRUBIN TOTAL: 0.4 mg/dL (ref 0.3–1.2)
BUN: 12 mg/dL (ref 6–20)
CALCIUM: 7.6 mg/dL — AB (ref 8.9–10.3)
CO2: 25 mmol/L (ref 22–32)
Chloride: 104 mmol/L (ref 101–111)
Creatinine, Ser: 0.64 mg/dL (ref 0.44–1.00)
GFR calc Af Amer: >60 mL/min (ref >60)
GFR calc non Af Amer: >60 mL/min (ref >60)
GLUCOSE: 106 mg/dL — AB (ref 65–99)
Potassium: 4.4 mmol/L (ref 3.5–5.1)
Sodium: 135 mmol/L (ref 135–145)
TOTAL PROTEIN: 6.2 g/dL — AB (ref 6.5–8.1)

## 2015-02-09 LAB — MAGNESIUM: Magnesium: 5.3 mg/dL — ABNORMAL HIGH (ref 1.7–2.4)

## 2015-02-09 NOTE — Lactation Note (Signed)
This note was copied from the chart of Girl Doreene NestJessica Belanger. Lactation Consultation Note  Patient Name: Girl Doreene NestJessica Rentz ZOXWR'UToday's Date: 02/09/2015 Reason for consult: Follow-up assessment   With this mom of a LPI, now 36 5/7 weeks CGa, and 34 hours old. Mom is beginning to express a few ml's of transitional milk. The baby is latching every 3 hours, with intermittent suckling, mostly with stimulation.  Mom is supplementing with curved tip syringe and finger sucking. I advised mom and dad to increase her amounts tomorrow by 10 ml's, up to 30 every 3, and let her take what she wants. I gave mom a foley cup, as another way to supplement baby, but told the parents to do what they are comfortable with. I also mentioned that depending on her weight, and how breastfeeding is going, and mom's milk supply, they may decide at some point to supplement by bottle, and that would be OK.  Mom is aware to make an o/p lactation appointment at discharge, and to call once home to see if she could get an appointment in a NICU slot, if they wanted to come in earlier that the slot they receive.    Maternal Data    Feeding Feeding Type: Formula Length of feed: 20 min (on and off sucking, with stimulation, baby sleepy at breast)  LATCH Score/Interventions Latch: Grasps breast easily, tongue down, lips flanged, rhythmical sucking. Intervention(s): Teach feeding cues;Waking techniques  Audible Swallowing: None  Type of Nipple: Everted at rest and after stimulation  Comfort (Breast/Nipple): Filling, red/small blisters or bruises, mild/mod discomfort  Problem noted: Mild/Moderate discomfort (pink, tender) Interventions (Mild/moderate discomfort): Comfort gels;Hand expression  Hold (Positioning): Assistance needed to correctly position infant at breast and maintain latch. Intervention(s): Breastfeeding basics reviewed;Support Pillows;Position options;Skin to skin  LATCH Score: 6  Lactation Tools  Discussed/Used Pump Review: Setup, frequency, and cleaning;Milk Storage   Consult Status Consult Status: Follow-up Date: 02/10/15 Follow-up type: In-patient    Alfred LevinsLee, Charlesetta Milliron Anne 02/09/2015, 6:54 PM

## 2015-02-09 NOTE — Progress Notes (Signed)
Subjective: Postpartum Day 1: Cesarean Delivery Patient reports tolerating PO and + flatus.   She denies headache visual disturbances or ruq pain.Marland Kitchen. Epigastric pain has resolved   Objective: Vital signs in last 24 hours: Temp:  [98.2 F (36.8 C)] 98.2 F (36.8 C) (12/13 0926) Pulse Rate:  [50-83] 63 (12/13 0926) Resp:  [18] 18 (12/13 0926) BP: (99-150)/(57-84) 127/79 mmHg (12/13 0926) SpO2:  [98 %-99 %] 98 % (12/13 0926)  Physical Exam:  General: alert and cooperative Lochia: appropriate Uterine Fundus: firm Incision:  Bandage clean dry and intact  DVT Evaluation: No evidence of DVT seen on physical exam.   Recent Labs  02/08/15 1712 02/09/15 0545  HGB 12.2 11.2*  HCT 34.3* 32.2*    Assessment/Plan: Status post Cesarean section. Doing well postoperatively.  Preeclampsia with severe features.. Discontinue magnesium sulfate at 930 am  Breastfeeding. _ lactation consultation today  Routine postpartum care .  Melinda [name] J. 02/09/2015, 12:27 PM

## 2015-02-09 NOTE — Lactation Note (Signed)
This note was copied from the chart of Melinda Doreene NestJessica Lacaze. Lactation Consultation Note  Baby is sleeping skin-to-skin on mom's chest.  Mom reports that baby is offered the breast at each feeding and often attaches.  Melinda [name] has been supplemented at the breast using a curved tip syringe.  She also is being finger fed with a curved tip syringe.  Discussed using a spoon for supplementation and then transitioning to a cup a supplementation volumes increase.  Parents are agreeable to this. Verbal instructions were given to the parents regarding technique but they will need to have this demonstrated to them.  Communicated this information to the NICU IBCLC who plans to follow-up with them.  Patient Name: Melinda [name] ZOXWR'UToday's Date: 02/09/2015     Maternal Data    Feeding    LATCH Score/Interventions                      Lactation Tools Discussed/Used     Consult Status      Melinda [name], Melinda [name] 02/09/2015, 12:11 PM

## 2015-02-10 NOTE — Clinical Social Work Maternal (Signed)
CLINICAL SOCIAL WORK MATERNAL/CHILD NOTE  Patient Details  Name: Melinda [name] MRN: [id number] Date of Birth: February 13, 1981  Date:  02/10/2015  Clinical Social Worker Initiating Note:  Loleta Books MSW, LCSW Date/ Time Initiated:  02/10/15/0900     Child's Name:  Melinda [name]   Legal Guardian:  Shanda Bumps and Tressie Ellis  Need for Interpreter:  None   Date of Referral:  02/08/15     Reason for Referral:  History of depression  Referral Source:  Central Nursery   Address:  [address] [place], [place] [address]  Phone number:  [phone]   Household Members:  Minor Children, Spouse   Natural Supports (not living in the home):  Immediate Family   Professional Supports: None   Employment: Full-time   Type of Work:   Education:    N/A  Architect:  Media planner   Other Resources:    None identified   Cultural/Religious Considerations Which May Impact Care:  None reported  Strengths:  Ability to meet basic needs , Merchandiser, retail , Home prepared for child    Risk Factors/Current Problems:  None   Cognitive State:  Able to Concentrate , Alert , Insightful , Linear Thinking , Goal Oriented    Mood/Affect:  Calm , Comfortable , Happy , Interested    CSW Assessment:  CSW received request for consult due to MOB presenting with a history of depression.  MOB presented and easily engaged and receptive to the visit. She presented in a pleasant mood, displayed a full range in affect, and readily answered questions related to her mental health history. No acute symptoms observed or noted in her thought process.  MOB reported history of situational depression in her early 32s (is now 34 years old). She stated that she participated in therapy for 6 weeks, and was prescribed medication (but never filled the script).  MOB denied any ongoing depression or complications.  MOB denied history of perinatal mood and anxiety disorders, and described normative feelings  of anxiety, worry, and frustration after her first child was born, but denied belief that it was outside of the normal range.   MOB denied any mental health complications during this pregnancy.  She stated that in the final weeks of pregnancy, she started to have an increase in racing thoughts about concerns about the pregnancy which led to insomnia.  MOB did not identify these symptoms as a problem.  CSW provided education on how these symptoms and how they are closely linked to anxiety.  CSW encouraged MOB to closely monitor her mood, and to discuss with her doctor if she notes ongoing racing thoughts, insomnia, additional symptoms of anxiety, and if she begins to identify symptoms as a problem.  CSW reviewed education on perinatal mood and anxiety disorders, and MOB denied questions or concerns at this time.   MOB expressed eagerness and readiness to be discharged. She stated that the home is prepared for the infant, and reported presence of a support system.  MOB reported belief that the infant is doing and feeding well considering her late preterm status.  She denied any concerns related to supplementing since she knows that the infant needs to be fed. MOB reported that she learned with her first infant that it is okay to supplement with formula, and discussed how it was easy to accept the need to supplement with this infant in comparison to her first.   MOB expressed appreciation for the visit, acknowledged ongoing CSW availability, and agreed to contact  CSW if additional needs arise.   CSW Plan/Description:   1. Patient/Family Education-- perinatal mood and anxiety disorders  2. No Further Intervention Required/No Barriers to Discharge    Kelby FamVenning, Tavoris Brisk N, LCSW 02/10/2015, 10:23 AM

## 2015-02-10 NOTE — Progress Notes (Signed)
Subjective: Postpartum Day 2: Cesarean Delivery Patient reports tolerating PO, + flatus and no problems voiding.    Objective: Vital signs in last 24 hours: Temp:  [97.9 F (36.6 C)-98.8 F (37.1 C)] 98.7 F (37.1 C) (12/14 1130) Pulse Rate:  [62-73] 73 (12/14 1430) Resp:  [16-18] 18 (12/14 1130) BP: (119-139)/(46-86) 137/71 mmHg (12/14 1430) SpO2:  [99 %] 99 % (12/14 1130)  Physical Exam:  General: alert and cooperative Lochia: appropriate Uterine Fundus: firm Incision: healing well DVT Evaluation: No evidence of DVT seen on physical exam.   Recent Labs  02/08/15 1712 02/09/15 0545  HGB 12.2 11.2*  HCT 34.3* 32.2*    Assessment/Plan: Status post Cesarean section. Doing well postoperatively.  Continue current care  BP normal  Plan discharge home in AM.  Melinda [name] J. 02/10/2015, 5:35 PM

## 2015-02-10 NOTE — Lactation Note (Signed)
This note was copied from the chart of Melinda Doreene NestJessica Biddy. Lactation Consultation Note  Patient Name: Melinda [name] UJWJX'BToday's Date: 02/10/2015 Reason for consult: Follow-up assessment;Late preterm infant   Follow up with parents of 6053 hour old infant. Infant BF x 4 for 10-30 min, BF attempts x 4, EBM Supplementation x 4 (4-15cc), Formula supplementation via bottle/syringe x 8 (8-20cc), 3 voids and 0 stools in last 24 hours. Last stool 12/13 @ 0530. LATCH scores 6-7 per bedside RN. Infant born at 8036 w 4 days and weighs 6 lb 1 oz today with weight loss of 8% since birth. Mom report she is pumping every 2-3 hours and milk volume is increasing today. Mom is please milk is coming in. She report when BF she is BF for up to 30 minutes to get 15 minutes per side then supplementing. Advised mom to BF for 15 minutes followed by supplementation to complete feeds in a 30 minute window alternating breast with each feeding. Mom is concerned with infants weight. Parents attempted to use a Tomee Tippee bottle with a 0 nipple and infant did not take volume from the bottle so they switched back to syringe feedings to ensure infant receives supplement. Left my phone # and requested mom call for next feeding for assessment. Reviewed LPT infant handout and appropriate supplementation amounts with parents. Will follow up with next feeding.   Maternal Data Formula Feeding for Exclusion: No  Feeding Feeding Type: Breast Milk with Formula added  LATCH Score/Interventions                      Lactation Tools Discussed/Used Pump Review: Setup, frequency, and cleaning   Consult Status Consult Status: Follow-up Date: 02/10/15 Follow-up type: In-patient    Silas FloodSharon S Evette Diclemente 02/10/2015, 2:25 PM

## 2015-02-10 NOTE — Lactation Note (Signed)
This note was copied from the chart of Girl Doreene NestJessica Wass. Lactation Consultation Note  Patient Name: Girl Doreene NestJessica Vigen ZOXWR'UToday's Date: 02/10/2015 Reason for consult: Follow-up assessment;Late preterm infant   Called to bedside by parents for feeding assessment. Mom with large breasts and large everted nipples. Both nipples excoriated and scabbed over, mom reports they are healing. She has comfort gels and is using expressed BM to nipples. Mom latched infant to breast in the cross cradle hold. Mom using good pillow support and positioning. Mom massaging and compressing breast with feedings. Infant would not sustain latch and came on and off breast. Offered to set up 5 fr feeding tube and syringe to breast, parents refused saying they had used one with first child and it caused more stress to mom. Offered infant formula in curved tip syringe while at breast to consolidate feeding, Infant never formed a really good seal and syringe required gentle pushing with feeding. Infant took 20 ml formula while BF and was asleep after feeding. Parents plan to try syringe at breast for next feeding, if goes well, they will continue if not they will BF for 15 minutes and then follow by finger feeding with curved tip syringe. Mom to call with questions/concerns/assistance as needed. LC plans to f/u tomorrow.    Maternal Data Formula Feeding for Exclusion: No  Feeding Feeding Type: Breast Fed Length of feed: 20 min  LATCH Score/Interventions Latch: Repeated attempts needed to sustain latch, nipple held in mouth throughout feeding, stimulation needed to elicit sucking reflex. Intervention(s): Teach feeding cues;Skin to skin;Waking techniques  Audible Swallowing: A few with stimulation (formula in syringe at breast) Intervention(s): Hand expression;Alternate breast massage  Type of Nipple: Everted at rest and after stimulation  Comfort (Breast/Nipple): Filling, red/small blisters or bruises, mild/mod  discomfort (Both nipples excoriated and scabbed)  Problem noted: Cracked, bleeding, blisters, bruises Interventions  (Cracked/bleeding/bruising/blister): Expressed breast milk to nipple  Hold (Positioning): No assistance needed to correctly position infant at breast. Intervention(s): Breastfeeding basics reviewed;Support Pillows;Position options;Skin to skin  LATCH Score: 7  Lactation Tools Discussed/Used Tools: Other (comment) (curved tip syringe) Pump Review: Setup, frequency, and cleaning   Consult Status Consult Status: Follow-up Date: 02/11/15 Follow-up type: In-patient    Silas FloodSharon S Bailen Geffre 02/10/2015, 3:54 PM

## 2015-02-11 DIAGNOSIS — O1410 Severe pre-eclampsia, unspecified trimester: Secondary | ICD-10-CM | POA: Diagnosis present

## 2015-02-11 MED ORDER — IBUPROFEN 600 MG PO TABS: 600.0000 mg | ORAL_TABLET | Freq: Four times a day (QID) | ORAL | Status: DC | PRN

## 2015-02-11 MED ORDER — OXYCODONE-ACETAMINOPHEN 5-325 MG PO TABS: 1.0000 | ORAL_TABLET | Freq: Four times a day (QID) | ORAL | Status: DC | PRN

## 2015-02-11 NOTE — Discharge Summary (Signed)
OB Discharge Summary     Patient Name: Melinda BoozeJessica S Dorey DOB: April 29, 1980 MRN: [id number]  Date of admission: 02/07/2015 Delivering MD: Gerald LeitzOLE, Kimblery Diop   Date of discharge: 02/11/2015  Admitting diagnosis: 36.4 WKS, CHEST PAIN, HIGH BP Intrauterine pregnancy: 2273w4d     Secondary diagnosis:  Principal Problem:   Sternal pain Active Problems:   Elevated BP   Preeclampsia, severe   Severe preeclampsia  Additional problems: h/o cesarean section      Discharge diagnosis: Preterm Pregnancy Delivered and Preeclampsia (severe)                                                                                                Post partum procedures:magnesium sulfate x 24 hours   Augmentation: NA  Complications: None  Hospital course:  Sceduled C/S   34 y.o. yo [id number] at 9773w4d was admitted to the hospital 02/07/2015 for  cesarean section with the following indication:severe preeclampsia .  Membrane Rupture Time/Date: 8:29 AM ,02/08/2015   Patient delivered a Viable infant.02/08/2015  Details of operation can be found in separate operative note.  Pateint had an uncomplicated postpartum course.  She is ambulating, tolerating a regular diet, passing flatus, and urinating well. Patient is discharged home in stable condition on No discharge date for patient encounter.          Physical exam  Filed Vitals:   02/10/15 1130 02/10/15 1430 02/10/15 1803 02/11/15 0731  BP: 128/64 137/71 134/73 135/82  Pulse: 68 73 63 59  Temp: 98.7 F (37.1 C)  98.3 F (36.8 C) 98.5 F (36.9 C)  TempSrc:   Oral Oral  Resp: 18  18 18   Height:      Weight:      SpO2: 99%      General: alert, cooperative and no distress Lochia: appropriate Uterine Fundus: firm Incision: Healing well with no significant drainage DVT Evaluation: No evidence of DVT seen on physical exam. Labs: Lab Results  Component Value Date   WBC 16.1* 02/09/2015   HGB 11.2* 02/09/2015   HCT 32.2* 02/09/2015   MCV 92.5 02/09/2015   PLT  143* 02/09/2015   CMP Latest Ref Rng 02/09/2015  Glucose 65 - 99 mg/dL 308(M106(H)  BUN 6 - 20 mg/dL 12  Creatinine 5.780.44 - 4.691.00 mg/dL 6.290.64  Sodium 528135 - 413145 mmol/L 135  Potassium 3.5 - 5.1 mmol/L 4.4  Chloride 101 - 111 mmol/L 104  CO2 22 - 32 mmol/L 25  Calcium 8.9 - 10.3 mg/dL 7.6(L)  Total Protein 6.5 - 8.1 g/dL 6.2(L)  Total Bilirubin 0.3 - 1.2 mg/dL 0.4  Alkaline Phos 38 - 126 U/L 88  AST 15 - 41 U/L 37  ALT 14 - 54 U/L 33    Discharge instruction: per After Visit Summary and "Baby and Me Booklet".  After visit meds:    Medication List    STOP taking these medications        acetaminophen 500 MG tablet  Commonly known as:  TYLENOL      TAKE these medications        [employer] EASY FIBER/CALCIUM Chew  Chew 2 each by mouth daily.     ibuprofen 600 MG tablet  Commonly known as:  ADVIL,MOTRIN  Take 1 tablet (600 mg total) by mouth every 6 (six) hours as needed for mild pain.     oxyCODONE-acetaminophen 5-325 MG tablet  Commonly known as:  PERCOCET/ROXICET  Take 1-2 tablets by mouth every 6 (six) hours as needed (for pain scale 4-7).     prenatal multivitamin Tabs tablet  Take 1 tablet by mouth daily.        Diet: routine diet  Activity: Advance as tolerated. Pelvic rest for 6 weeks.   Outpatient follow up:1 week  Follow up Appt:No future appointments. Follow up Visit:No Follow-up on file.  Postpartum contraception: None  Newborn Data: Live born female  Birth Weight: 6 lb 8.9 oz (2975 g) APGAR: 8, 8  Baby Feeding: Breast Disposition:home with mother   02/11/2015 Jessee Avers., MD

## 2015-02-11 NOTE — Lactation Note (Signed)
This note was copied from the chart of Melinda Doreene NestJessica Nakanishi. Lactation Consultation Note Mom states BF is going much better. Has some bruising and cracked nipples from first day but is much better. Has comfort gels. States baby is latching much better and feels comfortable going home. States she will call after she is home for a couple of days for [employer] OP f/u after she see's what her needs are. I explained appt. Are about a week out to get appt. Stated that was fine. Discussed support group, mastitis prevention, engorgement, supply and demand.  Patient Name: Melinda [name]: 02/11/2015 Reason for consult: Follow-up assessment   Maternal Data    Feeding    LATCH Score/Interventions       Type of Nipple: Everted at rest and after stimulation  Comfort (Breast/Nipple): Filling, red/small blisters or bruises, mild/mod discomfort  Problem noted: Cracked, bleeding, blisters, bruises Interventions  (Cracked/bleeding/bruising/blister): Expressed breast milk to nipple Interventions (Mild/moderate discomfort): Comfort gels;Hand massage;Hand expression  Hold (Positioning): No assistance needed to correctly position infant at breast. Intervention(s): Skin to skin;Position options;Support Pillows;Breastfeeding basics reviewed     Lactation Tools Discussed/Used     Consult Status Consult Status: Complete [name]: 02/11/15    Charyl DancerCARVER, Ciena Sampley G 02/11/2015, 5:06 AM

## 2015-02-11 NOTE — Lactation Note (Addendum)
This note was copied from the chart of Girl Doreene NestJessica Herbel. Lactation Consultation Note  Patient Name: Girl Doreene NestJessica Riecke JXBJY'NToday's Date: 02/11/2015    Reason for consult: Follow-up assessment;Late preterm infant   Follow up with parents. Mom reports Infant is BF better and is receiving supplementation via syringe while at breast. Infant weight gain of 2.3 oz in last 24 hours. Mom reports she is receiving 23 cc each pumping. Follow up outpatient appointment scheduled for Thursday 02/18/15 @ 1 pm. Mom and dad pleased with progress. Infant to have follow up Ped appt tomorrow. In  House MD requested that supplementation continue until after infant seen by Ped. Tomorrow. Mom has personal DEBP for home use.    Maternal Data Formula Feeding for Exclusion: No  Feeding    LATCH Score/Interventions                      Lactation Tools Discussed/Used     Consult Status Consult Status: Follow-up Date: 02/18/15 (1 pm OP Lactation Appointment) Follow-up type: Out-patient    Silas FloodSharon S Hice 02/11/2015, 10:01 AM

## 2015-02-18 ENCOUNTER — Ambulatory Visit (HOSPITAL_COMMUNITY): Admit: 2015-02-18 | Payer: Self-pay

## 2015-02-25 ENCOUNTER — Inpatient Hospital Stay (HOSPITAL_COMMUNITY): Admission: RE | Admit: 2015-02-25 | Payer: 59 | Source: Ambulatory Visit | Admitting: Obstetrics and Gynecology

## 2015-02-25 ENCOUNTER — Encounter (HOSPITAL_COMMUNITY): Admission: RE | Payer: Self-pay | Source: Ambulatory Visit

## 2015-02-25 SURGERY — Surgical Case
Anesthesia: Regional

## 2015-06-14 ENCOUNTER — Other Ambulatory Visit (HOSPITAL_COMMUNITY)
Admission: RE | Admit: 2015-06-14 | Discharge: 2015-06-14 | Disposition: A | Discharge status: Home / Self Care | Payer: 59 | Source: Ambulatory Visit | Attending: Obstetrics and Gynecology | Admitting: Obstetrics and Gynecology

## 2015-06-14 ENCOUNTER — Other Ambulatory Visit: Payer: Self-pay | Admitting: Obstetrics and Gynecology

## 2015-06-14 DIAGNOSIS — Z01419 Encounter for gynecological examination (general) (routine) without abnormal findings: Secondary | ICD-10-CM | POA: Insufficient documentation

## 2015-06-14 DIAGNOSIS — Z1151 Encounter for screening for human papillomavirus (HPV): Secondary | ICD-10-CM | POA: Insufficient documentation

## 2015-06-15 LAB — CYTOLOGY - PAP

## 2016-08-11 ENCOUNTER — Inpatient Hospital Stay (HOSPITAL_COMMUNITY): Payer: 59

## 2016-08-11 ENCOUNTER — Encounter (HOSPITAL_COMMUNITY): Payer: Self-pay | Admitting: *Deleted

## 2016-08-11 ENCOUNTER — Inpatient Hospital Stay (HOSPITAL_COMMUNITY)
Admission: AD | Admit: 2016-08-11 | Discharge: 2016-08-11 | Disposition: A | Discharge status: Home / Self Care | Payer: 59 | Source: Ambulatory Visit | Attending: Obstetrics & Gynecology | Admitting: Obstetrics & Gynecology

## 2016-08-11 DIAGNOSIS — O280 Abnormal hematological finding on antenatal screening of mother: Secondary | ICD-10-CM | POA: Insufficient documentation

## 2016-08-11 DIAGNOSIS — O4691 Antepartum hemorrhage, unspecified, first trimester: Secondary | ICD-10-CM

## 2016-08-11 DIAGNOSIS — O468X1 Other antepartum hemorrhage, first trimester: Secondary | ICD-10-CM

## 2016-08-11 DIAGNOSIS — O208 Other hemorrhage in early pregnancy: Secondary | ICD-10-CM | POA: Diagnosis not present

## 2016-08-11 DIAGNOSIS — O09891 Supervision of other high risk pregnancies, first trimester: Secondary | ICD-10-CM | POA: Diagnosis not present

## 2016-08-11 DIAGNOSIS — O2621 Pregnancy care for patient with recurrent pregnancy loss, first trimester: Secondary | ICD-10-CM | POA: Diagnosis not present

## 2016-08-11 DIAGNOSIS — Z3491 Encounter for supervision of normal pregnancy, unspecified, first trimester: Secondary | ICD-10-CM

## 2016-08-11 DIAGNOSIS — Z3A01 Less than 8 weeks gestation of pregnancy: Secondary | ICD-10-CM | POA: Diagnosis present

## 2016-08-11 DIAGNOSIS — O418X10 Other specified disorders of amniotic fluid and membranes, first trimester, not applicable or unspecified: Secondary | ICD-10-CM

## 2016-08-11 DIAGNOSIS — O0281 Inappropriate change in quantitative human chorionic gonadotropin (hCG) in early pregnancy: Secondary | ICD-10-CM

## 2016-08-11 DIAGNOSIS — N96 Recurrent pregnancy loss: Secondary | ICD-10-CM

## 2016-08-11 LAB — CBC
HCT: 37.7 % (ref 36.0–46.0)
Hemoglobin: 12.9 g/dL (ref 12.0–15.0)
MCH: 30.4 pg (ref 26.0–34.0)
MCHC: 34.2 g/dL (ref 30.0–36.0)
MCV: 88.9 fL (ref 78.0–100.0)
PLATELETS: 257 10*3/uL (ref 150–400)
RBC: 4.24 MIL/uL (ref 3.87–5.11)
RDW: 14.9 % (ref 11.5–15.5)
WBC: 9.5 10*3/uL (ref 4.0–10.5)

## 2016-08-11 LAB — HCG, QUANTITATIVE, PREGNANCY: hCG, Beta Chain, Quant, S: 15878 m[IU]/mL — ABNORMAL HIGH (ref <5)

## 2016-08-11 NOTE — MAU Note (Signed)
Urine in lab 

## 2016-08-11 NOTE — MAU Provider Note (Signed)
S: 36 y.o. [id number] @[redacted]w[redacted]d  by LMP/U/S presents to MAU for follow up results after scheduled outpatient ultrasound. She initially presented to [facility] office 2 days ago with [place]cg [place] 14782 and returned today with quant hcg of 95621.  She was sent from the office to evaluate pregnancy with inappropriate hcg rise.  She denies pain or bleeding today.    HPI  Review of Systems  Constitutional: Negative for chills, fever and malaise/fatigue.  Eyes: Negative for blurred vision.  Respiratory: Negative for cough and shortness of breath.   Cardiovascular: Negative for chest pain.  Gastrointestinal: Negative for heartburn, nausea and vomiting.  Genitourinary: Negative for dysuria, frequency and urgency.  Musculoskeletal: Negative.   Neurological: Negative for dizziness and headaches.  Psychiatric/Behavioral: Negative for depression.    O: BP 135/82 (BP Location: Right Arm)   Pulse 67   Temp 99 F (37.2 C) (Oral)   Resp 16   Wt 135 lb 12 oz (61.6 kg)   LMP 06/26/2016   SpO2 100%   BMI 21.26 kg/m  US Ob Comp Less 14 Wks  Result Date: 08/11/2016 CLINICAL DATA:  Inappropriate rise in beta HCG levels, 15,878 today versus 10,000 on Wednesday EXAM: OBSTETRIC <14 WK [place] AND TRANSVAGINAL OB US TECHNIQUE: Both transabdominal and transvaginal ultrasound examinations were performed for complete evaluation of the gestation as well as the maternal uterus, adnexal regions, and pelvic cul-de-sac. Transvaginal technique was performed to assess early pregnancy. COMPARISON:  None. FINDINGS: Intrauterine gestational sac: Single Yolk sac:  Visualized. Embryo:  Visualized. Cardiac Activity: Visualized Heart Rate: 119  bpm CRL:  5.9  mm   6 w   2 d                  [place] EDC: 04/04/2017 Subchorionic hemorrhage: Small anterior focus of subchorionic hemorrhage. Maternal uterus/adnexae: Corpus luteum on the left. Normal right ovary. IMPRESSION: An approximately 6 week 2 day viable intrauterine gestation is noted with  ultrasound EDC of 04/04/2017. Small anterior subchorionic hemorrhage. Electronically Signed   By: Tollie Eth M.D.   On: 08/11/2016 22:27   US Ob Transvaginal  Result Date: 08/11/2016 CLINICAL DATA:  Inappropriate rise in beta HCG levels, 15,878 today versus 10,000 on Wednesday EXAM: OBSTETRIC <14 WK [place] AND TRANSVAGINAL OB US TECHNIQUE: Both transabdominal and transvaginal ultrasound examinations were performed for complete evaluation of the gestation as well as the maternal uterus, adnexal regions, and pelvic cul-de-sac. Transvaginal technique was performed to assess early pregnancy. COMPARISON:  None. FINDINGS: Intrauterine gestational sac: Single Yolk sac:  Visualized. Embryo:  Visualized. Cardiac Activity: Visualized Heart Rate: 119  bpm CRL:  5.9  mm   6 w   2 d                  [place] EDC: 04/04/2017 Subchorionic hemorrhage: Small anterior focus of subchorionic hemorrhage. Maternal uterus/adnexae: Corpus luteum on the left. Normal right ovary. IMPRESSION: An approximately 6 week 2 day viable intrauterine gestation is noted with ultrasound EDC of 04/04/2017. Small anterior subchorionic hemorrhage. Electronically Signed   By: Tollie Eth M.D.   On: 08/11/2016 22:27   Results for orders placed or performed during the hospital encounter of 08/11/16 (from the past 24 hour(s))  CBC     Status: None   Collection Time: 08/11/16  7:22 PM  Result Value Ref Range   WBC 9.5 4.0 - 10.5 K/uL   RBC 4.24 3.87 - 5.11 MIL/uL   Hemoglobin 12.9 12.0 - 15.0 g/dL  HCT 37.7 36.0 - 46.0 %   MCV 88.9 78.0 - 100.0 fL   MCH 30.4 26.0 - 34.0 pg   MCHC 34.2 30.0 - 36.0 g/dL   RDW 81.114.9 91.411.5 - 78.215.5 %   Platelets 257 150 - 400 K/uL  hCG, quantitative, pregnancy     Status: Abnormal   Collection Time: 08/11/16  7:22 PM  Result Value Ref Range   hCG, Beta Chain, Quant, S 15,878 (H) <5 mIU/mL    VS reviewed, nursing note reviewed,  Constitutional: well developed, well nourished, no distress HEENT: normocephalic CV:  normal rate Pulm/chest wall: normal effort Abdomen: soft Neuro: alert and oriented x 3 Skin: warm, dry Psych: affect normal  MDM: Ordered labs/imaging and US and reviewed results.  Results indicate IUP.  Pt given routine first trimester precautions.  Discussed small subchorionic hemorrhage with pt, likely good prognosis.  Consult Dr Dion BodyVarnado with assessment and findings.  Pt to follow up with Dr Richardson Doppole as planned. Pt stable at time of discharge.  A:  1. Normal IUP (intrauterine pregnancy) on prenatal ultrasound, first trimester   2. Inappropriate level of quantitative human chorionicgonadotropin (hCG) for gestational age in early pregnancy   3. [redacted] weeks gestation of pregnancy   4. Inappropriate level of quantitative hCG for gestational age in early pregnancy   5. History of recurrent miscarriages   6. Subchorionic hemorrhage of placenta in first trimester, single or unspecified fetus     P: D/C home Follow up in office as planned   Sharen CounterLisa Leftwich-Kirby, CNM 8:57 AM

## 2016-08-11 NOTE — MAU Note (Signed)
hcg levels aren't rising appropriately, around 10,000 on Wed, was around 13,000 today.  Sent over for US, wanting to make sure it's not an ectopic. Has not had an US yet. No pain or bleeding.

## 2016-08-11 NOTE — MAU Note (Signed)
Sharen CounterLisa Leftwich-Kirby CNM in Triage to discuss test results and d/c plan. Pt d/c home from Triage.

## 2017-03-01 ENCOUNTER — Other Ambulatory Visit: Payer: Self-pay | Admitting: Obstetrics and Gynecology

## 2017-03-01 ENCOUNTER — Observation Stay (HOSPITAL_COMMUNITY): Payer: 59

## 2017-03-01 ENCOUNTER — Other Ambulatory Visit: Payer: Self-pay

## 2017-03-01 ENCOUNTER — Observation Stay (HOSPITAL_COMMUNITY)
Admission: AD | Admit: 2017-03-01 | Discharge: 2017-03-02 | Disposition: A | Discharge status: Home / Self Care | Payer: 59 | Source: Ambulatory Visit | Attending: Obstetrics and Gynecology | Admitting: Obstetrics and Gynecology

## 2017-03-01 ENCOUNTER — Encounter (HOSPITAL_COMMUNITY): Payer: Self-pay

## 2017-03-01 DIAGNOSIS — Z3A35 35 weeks gestation of pregnancy: Secondary | ICD-10-CM | POA: Diagnosis not present

## 2017-03-01 DIAGNOSIS — O368131 Decreased fetal movements, third trimester, fetus 1: Secondary | ICD-10-CM | POA: Diagnosis not present

## 2017-03-01 DIAGNOSIS — Z79899 Other long term (current) drug therapy: Secondary | ICD-10-CM | POA: Insufficient documentation

## 2017-03-01 DIAGNOSIS — R51 Headache: Secondary | ICD-10-CM | POA: Diagnosis not present

## 2017-03-01 DIAGNOSIS — Z8759 Personal history of other complications of pregnancy, childbirth and the puerperium: Secondary | ICD-10-CM | POA: Insufficient documentation

## 2017-03-01 DIAGNOSIS — O26893 Other specified pregnancy related conditions, third trimester: Secondary | ICD-10-CM | POA: Insufficient documentation

## 2017-03-01 DIAGNOSIS — O139 Gestational [pregnancy-induced] hypertension without significant proteinuria, unspecified trimester: Secondary | ICD-10-CM

## 2017-03-01 DIAGNOSIS — Z7982 Long term (current) use of aspirin: Secondary | ICD-10-CM | POA: Diagnosis not present

## 2017-03-01 DIAGNOSIS — O34219 Maternal care for unspecified type scar from previous cesarean delivery: Secondary | ICD-10-CM | POA: Diagnosis not present

## 2017-03-01 DIAGNOSIS — Z3689 Encounter for other specified antenatal screening: Secondary | ICD-10-CM | POA: Insufficient documentation

## 2017-03-01 DIAGNOSIS — O133 Gestational [pregnancy-induced] hypertension without significant proteinuria, third trimester: Principal | ICD-10-CM | POA: Insufficient documentation

## 2017-03-01 DIAGNOSIS — R030 Elevated blood-pressure reading, without diagnosis of hypertension: Secondary | ICD-10-CM | POA: Diagnosis present

## 2017-03-01 LAB — URIC ACID: Uric Acid, Serum: 5.8 mg/dL (ref 2.3–6.6)

## 2017-03-01 LAB — COMPREHENSIVE METABOLIC PANEL
ALT: 23 U/L (ref 14–54)
ANION GAP: 8 (ref 5–15)
AST: 28 U/L (ref 15–41)
Albumin: 2.7 g/dL — ABNORMAL LOW (ref 3.5–5.0)
Alkaline Phosphatase: 90 U/L (ref 38–126)
BUN: 12 mg/dL (ref 6–20)
CHLORIDE: 105 mmol/L (ref 101–111)
CO2: 21 mmol/L — AB (ref 22–32)
Calcium: 8.3 mg/dL — ABNORMAL LOW (ref 8.9–10.3)
Creatinine, Ser: 0.53 mg/dL (ref 0.44–1.00)
GFR calc Af Amer: >60 mL/min (ref >60)
GFR calc non Af Amer: >60 mL/min (ref >60)
Glucose, Bld: 135 mg/dL — ABNORMAL HIGH (ref 65–99)
POTASSIUM: 3.8 mmol/L (ref 3.5–5.1)
SODIUM: 134 mmol/L — AB (ref 135–145)
Total Bilirubin: 0.7 mg/dL (ref 0.3–1.2)
Total Protein: 6.1 g/dL — ABNORMAL LOW (ref 6.5–8.1)

## 2017-03-01 LAB — URINALYSIS, ROUTINE W REFLEX MICROSCOPIC
BILIRUBIN URINE: NEGATIVE
Glucose, UA: NEGATIVE mg/dL
HGB URINE DIPSTICK: NEGATIVE
Ketones, ur: NEGATIVE mg/dL
Leukocytes, UA: NEGATIVE
Nitrite: NEGATIVE
PH: 6 (ref 5.0–8.0)
Protein, ur: NEGATIVE mg/dL
Specific Gravity, Urine: 1.015 (ref 1.005–1.030)

## 2017-03-01 LAB — CBC
HCT: 37.2 % (ref 36.0–46.0)
Hemoglobin: 13 g/dL (ref 12.0–15.0)
MCH: 32.4 pg (ref 26.0–34.0)
MCHC: 34.9 g/dL (ref 30.0–36.0)
MCV: 92.8 fL (ref 78.0–100.0)
PLATELETS: 135 10*3/uL — AB (ref 150–400)
RBC: 4.01 MIL/uL (ref 3.87–5.11)
RDW: 13 % (ref 11.5–15.5)
WBC: 12.4 10*3/uL — ABNORMAL HIGH (ref 4.0–10.5)

## 2017-03-01 LAB — LACTATE DEHYDROGENASE: LDH: 132 U/L (ref 98–192)

## 2017-03-01 LAB — TYPE AND SCREEN
ABO/RH(D): A POS
ANTIBODY SCREEN: NEGATIVE

## 2017-03-01 LAB — PROTEIN / CREATININE RATIO, URINE
CREATININE, URINE: 93 mg/dL
Protein Creatinine Ratio: 0.08 mg/mg{Cre} (ref 0.00–0.15)
TOTAL PROTEIN, URINE: 7 mg/dL

## 2017-03-01 MED ORDER — BUTALBITAL-APAP-CAFFEINE 50-325-40 MG PO TABS
2.0000 | ORAL_TABLET | ORAL | Status: DC | PRN
  Administered 2017-03-01 – 2017-03-02 (×2): 2 via ORAL
  Filled 2017-03-01 (×2): qty 2

## 2017-03-01 MED ORDER — ACETAMINOPHEN 325 MG PO TABS
650.0000 mg | ORAL_TABLET | ORAL | Status: DC | PRN
  Administered 2017-03-01: 650 mg via ORAL
  Filled 2017-03-01: qty 2

## 2017-03-01 MED ORDER — BETAMETHASONE SOD PHOS & ACET 6 (3-3) MG/ML IJ SUSP
12.0000 mg | INTRAMUSCULAR | Status: AC
  Administered 2017-03-01 – 2017-03-02 (×2): 12 mg via INTRAMUSCULAR
  Filled 2017-03-01 (×2): qty 2

## 2017-03-01 MED ORDER — ZOLPIDEM TARTRATE 5 MG PO TABS: 5.0000 mg | ORAL_TABLET | Freq: Every evening | ORAL | Status: DC | PRN

## 2017-03-01 MED ORDER — CALCIUM CARBONATE ANTACID 500 MG PO CHEW: 2.0000 | CHEWABLE_TABLET | ORAL | Status: DC | PRN

## 2017-03-01 MED ORDER — PRENATAL MULTIVITAMIN CH
1.0000 | ORAL_TABLET | Freq: Every day | ORAL | Status: DC
  Administered 2017-03-02: 1 via ORAL
  Filled 2017-03-01: qty 1

## 2017-03-01 MED ORDER — DOCUSATE SODIUM 100 MG PO CAPS
100.0000 mg | ORAL_CAPSULE | Freq: Every day | ORAL | Status: DC
  Filled 2017-03-01: qty 1

## 2017-03-01 MED ORDER — HYDRALAZINE HCL 20 MG/ML IJ SOLN: 10.0000 mg | Freq: Once | INTRAMUSCULAR | Status: DC | PRN

## 2017-03-01 MED ORDER — LABETALOL HCL 5 MG/ML IV SOLN: 20.0000 mg | INTRAVENOUS | Status: DC | PRN

## 2017-03-01 NOTE — H&P (Signed)
Melinda BoozeJessica S [name] is a 37 y.o. female presenting for observation to rule out preeclampsia. She presented to the office for routine appt. bp was 147/91. She is complaining of constant headache. She has taken tylenol with only partial relief. Pregnancy complicated by  H/o cesarean section x 2 and HeLLP syndrome with previous pregnancy. Pt reports fetal movement but decreased./ no lof no contractions.    OB History    Gravida Para Term Preterm AB Living   4 2 1 1 1 2    SAB TAB Ectopic Multiple Live Births         0 2     Past Medical History:  Diagnosis Date  . Hypertension    Gestational HTN - Postpartum Mag  . Vaginal Pap smear, abnormal    ASCUS   Past Surgical History:  Procedure Laterality Date  . BREAST SURGERY     implants  . CESAREAN SECTION N/A 08/20/2013   Procedure: CESAREAN SECTION;  Surgeon: Dorien Chihuahuaara J. Richardson Doppole, MD;  Location: [facility];  Service: Obstetrics;  Laterality: N/A;  . CESAREAN SECTION N/A 02/08/2015   Procedure: CESAREAN SECTION;  Surgeon: Gerald Leitzara Keiley Levey, MD;  Location: [facility] ORS;  Service: Obstetrics;  Laterality: N/A;   Family History: family history includes Alcohol abuse in her father; Cancer in her maternal grandmother and paternal grandmother; Varicose Veins in her maternal grandmother. Social History:  reports that she quit smoking about 7 years ago. she has never used smokeless tobacco. She reports that she does not drink alcohol or use drugs.     Maternal Diabetes: No Genetic Screening: Normal Maternal Ultrasounds/Referrals: Normal Fetal Ultrasounds or other Referrals:  None Maternal Substance Abuse:  No Significant Maternal Medications:  None Significant Maternal Lab Results:  None Other Comments:  None  Review of Systems  Constitutional: Negative.   HENT: Negative.   Eyes: Negative.   Respiratory: Negative.   Cardiovascular: Negative.   Gastrointestinal: Negative.   Genitourinary: Negative.   Musculoskeletal: Negative.   Skin: Negative.   Neurological:  Positive for headaches.  Endo/Heme/Allergies: Negative.   Psychiatric/Behavioral: Negative.    History   Last menstrual period 06/26/2016, unknown if currently breastfeeding.   Fetal Exam Fetal Monitor Review: Mode: hand-held doppler probe.   Baseline rate: 135.      Physical Exam  Vitals reviewed. Constitutional: She is oriented to person, place, and time. She appears well-developed and well-nourished.  HENT:  Head: Normocephalic and atraumatic.  Eyes: Conjunctivae are normal. Pupils are equal, round, and reactive to light.  Neck: Normal range of motion. Neck supple.  Cardiovascular: Normal rate and regular rhythm.  Respiratory: Effort normal and breath sounds normal.  GI: There is no tenderness.  Musculoskeletal: Normal range of motion. She exhibits edema.  Neurological: She is alert and oriented to person, place, and time. She displays abnormal reflex.  2+ reflexes   Skin: Skin is warm and dry.  Psychiatric: She has a normal mood and affect.    Prenatal labs: ABO, Rh:   A positive  Antibody:  Negative  Rubella:  Immune RPR:   Nonreactive HBsAg:   Negative  HIV:   Negative  GBS:   pending  Assessment/Plan: 35 wks and 3 days with elevated bp in office / H/o Cesarean section x 2  - Admit for observation to antenatal unit to rule out preeclampsia  - Serial bp - PIH labs -Ultrasound for EF and /AFI -Betamethasone for Fetal lung maturity  -Dr. Dion BodyVarnado covering until 7 pm.. [facility] to cover 7 pm to  7 am.     Mckinley Olheiser J. 03/01/2017, 12:11 PM

## 2017-03-01 NOTE — Progress Notes (Signed)
In to discuss lab results with pt and reassess. She has eaten and taken Tylenol. Headache has resolved. Questions about plan of care, discharge, bedrest.   Patient Vitals for the past 24 hrs:  BP Temp Temp src Pulse Resp SpO2 Height Weight  03/01/17 1517 126/64 - - 70 - - - -  03/01/17 1515 126/64 - - 71 18 100 % - -  03/01/17 1507 - - - - - - 5\' 6"  (1.676 m) 82.1 kg (181 lb)  03/01/17 1400 (!) 127/95 98.5 F (36.9 C) Oral - 18 100 % - -   PCR 0.08  CBC    Component Value Date/Time   WBC 12.4 (H) 03/01/2017 1428   RBC 4.01 03/01/2017 1428   HGB 13.0 03/01/2017 1428   HGB 13.4 06/30/2012 1226   HCT 37.2 03/01/2017 1428   HCT 38.5 06/30/2012 1226   PLT 135 (L) 03/01/2017 1428   PLT 243 06/30/2012 1226   MCV 92.8 03/01/2017 1428   MCV 92 06/30/2012 1226   MCH 32.4 03/01/2017 1428   MCHC 34.9 03/01/2017 1428   RDW 13.0 03/01/2017 1428   RDW 12.7 06/30/2012 1226   LYMPHSABS 1.6 02/09/2015 0545   MONOABS 1.4 (H) 02/09/2015 0545   EOSABS 0.0 02/09/2015 0545   BASOSABS 0.0 02/09/2015 0545   CMP     Component Value Date/Time   NA 134 (L) 03/01/2017 1428   NA 140 06/30/2012 1226   K 3.8 03/01/2017 1428   K 3.6 06/30/2012 1226   CL 105 03/01/2017 1428   CL 107 06/30/2012 1226   CO2 21 (L) 03/01/2017 1428   CO2 24 06/30/2012 1226   GLUCOSE 135 (H) 03/01/2017 1428   GLUCOSE 120 (H) 06/30/2012 1226   BUN 12 03/01/2017 1428   BUN 10 06/30/2012 1226   CREATININE 0.53 03/01/2017 1428   CREATININE 0.59 (L) 06/30/2012 1226   CALCIUM 8.3 (L) 03/01/2017 1428   CALCIUM 9.1 06/30/2012 1226   PROT 6.1 (L) 03/01/2017 1428   PROT 7.1 06/30/2012 1226   ALBUMIN 2.7 (L) 03/01/2017 1428   ALBUMIN 3.9 06/30/2012 1226   AST 28 03/01/2017 1428   AST 31 06/30/2012 1226   ALT 23 03/01/2017 1428   ALT 41 06/30/2012 1226   ALKPHOS 90 03/01/2017 1428   ALKPHOS 61 06/30/2012 1226   BILITOT 0.7 03/01/2017 1428   BILITOT 0.8 06/30/2012 1226   GFRNONAA >60 03/01/2017 1428   GFRNONAA >60  06/30/2012 1226   GFRAA >60 03/01/2017 1428   GFRAA >60 06/30/2012 1226   NST reactive. Ob ultrasound report pending-AFI 13 from images.  Reviewed above results. Spoke with pt and husband 20-30 minutes.  All questions answered. Plan is to repeat labs tomorrow and if pt remains stable, will likely be discharged tomorrow after 2nd BMZ.  Dr. Richardson Doppole will be back in the am to make that decision.  Spoke with Dr. Katrinka BlazingSmith, NICU.  They are currently full but 4 discharges are anticipated for tomorrow.  If delivered tonight, pt would need to be transferred.  This was not discussed with pt to avoid anxiety.  Will check out to Dr. Normand Sloopillard.  Pt informed.

## 2017-03-01 NOTE — H&P (Deleted)
  The note originally documented on this encounter has been moved the the encounter in which it belongs.  

## 2017-03-02 DIAGNOSIS — O133 Gestational [pregnancy-induced] hypertension without significant proteinuria, third trimester: Secondary | ICD-10-CM | POA: Diagnosis not present

## 2017-03-02 LAB — COMPREHENSIVE METABOLIC PANEL
ALBUMIN: 2.6 g/dL — AB (ref 3.5–5.0)
ALK PHOS: 91 U/L (ref 38–126)
ALT: 22 U/L (ref 14–54)
AST: 22 U/L (ref 15–41)
Anion gap: 10 (ref 5–15)
BILIRUBIN TOTAL: 0.9 mg/dL (ref 0.3–1.2)
BUN: 16 mg/dL (ref 6–20)
CALCIUM: 8.6 mg/dL — AB (ref 8.9–10.3)
CO2: 18 mmol/L — AB (ref 22–32)
CREATININE: 0.59 mg/dL (ref 0.44–1.00)
Chloride: 108 mmol/L (ref 101–111)
GFR calc non Af Amer: >60 mL/min (ref >60)
GLUCOSE: 108 mg/dL — AB (ref 65–99)
Potassium: 5.2 mmol/L — ABNORMAL HIGH (ref 3.5–5.1)
Sodium: 136 mmol/L (ref 135–145)
TOTAL PROTEIN: 6.1 g/dL — AB (ref 6.5–8.1)

## 2017-03-02 LAB — CBC
HEMATOCRIT: 37.7 % (ref 36.0–46.0)
HEMOGLOBIN: 13.3 g/dL (ref 12.0–15.0)
MCH: 32.6 pg (ref 26.0–34.0)
MCHC: 35.3 g/dL (ref 30.0–36.0)
MCV: 92.4 fL (ref 78.0–100.0)
Platelets: 146 10*3/uL — ABNORMAL LOW (ref 150–400)
RBC: 4.08 MIL/uL (ref 3.87–5.11)
RDW: 12.9 % (ref 11.5–15.5)
WBC: 22.1 10*3/uL — ABNORMAL HIGH (ref 4.0–10.5)

## 2017-03-02 LAB — URIC ACID: Uric Acid, Serum: 6.1 mg/dL (ref 2.3–6.6)

## 2017-03-02 LAB — LACTATE DEHYDROGENASE: LDH: 119 U/L (ref 98–192)

## 2017-03-02 LAB — PROTEIN / CREATININE RATIO, URINE: CREATININE, URINE: 36 mg/dL

## 2017-03-02 LAB — RPR: RPR Ser Ql: NONREACTIVE

## 2017-03-02 MED ORDER — BUTALBITAL-APAP-CAFFEINE 50-325-40 MG PO TABS: 2.0000 | ORAL_TABLET | ORAL | 0 refills | Status: DC | PRN

## 2017-03-02 NOTE — Discharge Summary (Signed)
    OB Discharge Summary     Patient Name: Melinda BoozeJessica S Sundell DOB: 10-Dec-1980 MRN: [id number]  Date of admission: 03/01/2017 Delivering MD: This patient has no babies on file.  Date of discharge: 03/02/2017  Admitting diagnosis: 35WKS, INCREASED BP Intrauterine pregnancy: 5752w4d     Secondary diagnosis:  Active Problems:   Elevated BP without diagnosis of hypertension  Additional problems: none    Discharge diagnosis: elevated bp without diagnosis of hypertension                                                                                                 Post partum procedures:NA  Augmentation: NA  Complications: None  Hospital course:   Pt was admitted for observation to rule out gestational hypertension and preeclampsia. Platelets 135 and repeat 146.Marland Kitchen. PCR was below the detectable level.  Pt received betamethasone for fetal lung maturity. Her initial blood pressure on arrival was 127/95 subsequent bp's 117-132/63-82. She initially had a headache on arrival however it resolved with fioricet.  She is discharged in stable and improved condition. She does not meet criteria for diagnosis of gestational hypertension or preeclampsia.   Physical exam  Vitals:   03/02/17 0020 03/02/17 0400 03/02/17 0755 03/02/17 1241  BP: 130/73 126/82 132/74 117/63  Pulse: 68 72 (!) 59 68  Resp: 17 16 16 16   Temp: 98.4 F (36.9 C) 98.1 F (36.7 C) 98.3 F (36.8 C) 98.4 F (36.9 C)  TempSrc: Oral Oral Oral Oral  SpO2: 98% 98% 98% 96%  Weight:      Height:       General: alert, cooperative and no distress  Uterine Fundus: gravid Incision: N/A DVT Evaluation: No evidence of DVT seen on physical exam. Labs: Lab Results  Component Value Date   WBC 22.1 (H) 03/02/2017   HGB 13.3 03/02/2017   HCT 37.7 03/02/2017   MCV 92.4 03/02/2017   PLT 146 (L) 03/02/2017   CMP Latest Ref Rng & Units 03/02/2017  Glucose 65 - 99 mg/dL 409(W108(H)  BUN 6 - 20 mg/dL 16  Creatinine 1.190.44 - 1.471.00 mg/dL 8.290.59  Sodium 562135  - 130145 mmol/L 136  Potassium 3.5 - 5.1 mmol/L 5.2(H)  Chloride 101 - 111 mmol/L 108  CO2 22 - 32 mmol/L 18(L)  Calcium 8.9 - 10.3 mg/dL 8.6(V8.6(L)  Total Protein 6.5 - 8.1 g/dL 6.1(L)  Total Bilirubin 0.3 - 1.2 mg/dL 0.9  Alkaline Phos 38 - 126 U/L 91  AST 15 - 41 U/L 22  ALT 14 - 54 U/L 22    Discharge instruction: per After Visit Summary and "Baby and Me Booklet".  After visit meds:  Prenatal vitamin  Fioricet Aspirin 81 mg daily   Diet: routine diet  Activity: Advance as tolerated. Pelvic rest for 6 weeks.   Outpatient follow up:4 days  Follow up Appt:No future appointments. Follow up Visit:No Follow-up on file.     03/02/2017 Jessee AversOLE,Deontre Allsup J., MD

## 2017-03-03 LAB — CULTURE, BETA STREP (GROUP B ONLY)

## 2017-03-08 ENCOUNTER — Inpatient Hospital Stay (HOSPITAL_COMMUNITY)
Admission: AD | Admit: 2017-03-08 | Discharge: 2017-03-11 | DRG: 788 | Disposition: A | Discharge status: Home / Self Care | Payer: 59 | Source: Ambulatory Visit | Attending: Obstetrics and Gynecology | Admitting: Obstetrics and Gynecology

## 2017-03-08 ENCOUNTER — Inpatient Hospital Stay (HOSPITAL_COMMUNITY): Payer: 59 | Admitting: Anesthesiology

## 2017-03-08 ENCOUNTER — Other Ambulatory Visit: Payer: Self-pay

## 2017-03-08 ENCOUNTER — Encounter (HOSPITAL_COMMUNITY): Payer: Self-pay | Admitting: *Deleted

## 2017-03-08 ENCOUNTER — Other Ambulatory Visit: Payer: Self-pay | Admitting: Obstetrics and Gynecology

## 2017-03-08 ENCOUNTER — Encounter (HOSPITAL_COMMUNITY)
Admission: AD | Disposition: A | Discharge status: Home / Self Care | Payer: Self-pay | Source: Ambulatory Visit | Attending: Obstetrics and Gynecology

## 2017-03-08 DIAGNOSIS — O34211 Maternal care for low transverse scar from previous cesarean delivery: Secondary | ICD-10-CM | POA: Diagnosis present

## 2017-03-08 DIAGNOSIS — O99214 Obesity complicating childbirth: Secondary | ICD-10-CM | POA: Diagnosis present

## 2017-03-08 DIAGNOSIS — O1413 Severe pre-eclampsia, third trimester: Secondary | ICD-10-CM | POA: Diagnosis present

## 2017-03-08 DIAGNOSIS — Z87891 Personal history of nicotine dependence: Secondary | ICD-10-CM | POA: Diagnosis not present

## 2017-03-08 DIAGNOSIS — O1414 Severe pre-eclampsia complicating childbirth: Secondary | ICD-10-CM | POA: Diagnosis present

## 2017-03-08 DIAGNOSIS — E669 Obesity, unspecified: Secondary | ICD-10-CM | POA: Diagnosis present

## 2017-03-08 DIAGNOSIS — Z3A36 36 weeks gestation of pregnancy: Secondary | ICD-10-CM | POA: Diagnosis not present

## 2017-03-08 DIAGNOSIS — Z98891 History of uterine scar from previous surgery: Secondary | ICD-10-CM

## 2017-03-08 LAB — CBC
HCT: 40.5 % (ref 36.0–46.0)
Hemoglobin: 14 g/dL (ref 12.0–15.0)
MCH: 32.3 pg (ref 26.0–34.0)
MCHC: 34.6 g/dL (ref 30.0–36.0)
MCV: 93.3 fL (ref 78.0–100.0)
PLATELETS: 154 10*3/uL (ref 150–400)
RBC: 4.34 MIL/uL (ref 3.87–5.11)
RDW: 13.3 % (ref 11.5–15.5)
WBC: 14.8 10*3/uL — AB (ref 4.0–10.5)

## 2017-03-08 LAB — TYPE AND SCREEN
ABO/RH(D): A POS
Antibody Screen: NEGATIVE

## 2017-03-08 LAB — PROTEIN / CREATININE RATIO, URINE
Creatinine, Urine: 92 mg/dL
PROTEIN CREATININE RATIO: 0.09 mg/mg{creat} (ref 0.00–0.15)
TOTAL PROTEIN, URINE: 8 mg/dL

## 2017-03-08 LAB — COMPREHENSIVE METABOLIC PANEL
ALT: 35 U/L (ref 14–54)
AST: 42 U/L — AB (ref 15–41)
Albumin: 3 g/dL — ABNORMAL LOW (ref 3.5–5.0)
Alkaline Phosphatase: 105 U/L (ref 38–126)
Anion gap: 8 (ref 5–15)
BUN: 13 mg/dL (ref 6–20)
CHLORIDE: 106 mmol/L (ref 101–111)
CO2: 22 mmol/L (ref 22–32)
CREATININE: 0.65 mg/dL (ref 0.44–1.00)
Calcium: 8.8 mg/dL — ABNORMAL LOW (ref 8.9–10.3)
GFR calc Af Amer: >60 mL/min (ref >60)
GLUCOSE: 81 mg/dL (ref 65–99)
Potassium: 5.1 mmol/L (ref 3.5–5.1)
SODIUM: 136 mmol/L (ref 135–145)
Total Bilirubin: 0.4 mg/dL (ref 0.3–1.2)
Total Protein: 6.6 g/dL (ref 6.5–8.1)

## 2017-03-08 LAB — URIC ACID: URIC ACID, SERUM: 7.4 mg/dL — AB (ref 2.3–6.6)

## 2017-03-08 LAB — LACTATE DEHYDROGENASE: LDH: 235 U/L — AB (ref 98–192)

## 2017-03-08 SURGERY — Surgical Case
Anesthesia: Spinal

## 2017-03-08 SURGERY — Surgical Case
Anesthesia: Regional

## 2017-03-08 MED ORDER — BUPIVACAINE IN DEXTROSE 0.75-8.25 % IT SOLN
INTRATHECAL | Status: DC | PRN
  Administered 2017-03-08: 1.6 mL via INTRATHECAL

## 2017-03-08 MED ORDER — PRENATAL MULTIVITAMIN CH
1.0000 | ORAL_TABLET | Freq: Every day | ORAL | Status: DC
  Administered 2017-03-09 – 2017-03-10 (×2): 1 via ORAL
  Filled 2017-03-08 (×2): qty 1

## 2017-03-08 MED ORDER — SODIUM CHLORIDE 0.9% FLUSH: 3.0000 mL | INTRAVENOUS | Status: DC | PRN

## 2017-03-08 MED ORDER — OXYTOCIN 10 UNIT/ML IJ SOLN
INTRAMUSCULAR | Status: AC
  Filled 2017-03-08: qty 4

## 2017-03-08 MED ORDER — LABETALOL HCL 5 MG/ML IV SOLN: 20.0000 mg | INTRAVENOUS | Status: DC | PRN

## 2017-03-08 MED ORDER — LACTATED RINGERS IV SOLN
INTRAVENOUS | Status: DC
  Administered 2017-03-08 (×2): via INTRAVENOUS

## 2017-03-08 MED ORDER — DIPHENHYDRAMINE HCL 25 MG PO CAPS: 25.0000 mg | ORAL_CAPSULE | ORAL | Status: DC | PRN

## 2017-03-08 MED ORDER — DIPHENHYDRAMINE HCL 50 MG/ML IJ SOLN
12.5000 mg | INTRAMUSCULAR | Status: DC | PRN
Start: 2017-03-08 — End: 2017-03-11

## 2017-03-08 MED ORDER — NALOXONE HCL 4 MG/10ML IJ SOLN: 1.0000 ug/kg/h | INTRAVENOUS | Status: DC | PRN

## 2017-03-08 MED ORDER — FENTANYL CITRATE (PF) 100 MCG/2ML IJ SOLN
INTRAMUSCULAR | Status: AC
  Filled 2017-03-08: qty 2

## 2017-03-08 MED ORDER — OXYTOCIN 40 UNITS IN LACTATED RINGERS INFUSION - SIMPLE MED: 2.5000 [IU]/h | INTRAVENOUS | Status: AC

## 2017-03-08 MED ORDER — PHENYLEPHRINE 8 MG IN D5W 100 ML (0.08MG/ML) PREMIX OPTIME
INJECTION | INTRAVENOUS | Status: DC | PRN
  Administered 2017-03-08: 30 ug/min via INTRAVENOUS

## 2017-03-08 MED ORDER — SOD CITRATE-CITRIC ACID 500-334 MG/5ML PO SOLN
30.0000 mL | ORAL | Status: AC
  Administered 2017-03-08: 30 mL via ORAL
  Filled 2017-03-08: qty 15

## 2017-03-08 MED ORDER — ZOLPIDEM TARTRATE 5 MG PO TABS: 5.0000 mg | ORAL_TABLET | Freq: Every evening | ORAL | Status: DC | PRN

## 2017-03-08 MED ORDER — NALBUPHINE HCL 10 MG/ML IJ SOLN: 5.0000 mg | INTRAMUSCULAR | Status: DC | PRN

## 2017-03-08 MED ORDER — OXYCODONE HCL 5 MG PO TABS: 5.0000 mg | ORAL_TABLET | Freq: Once | ORAL | Status: DC | PRN

## 2017-03-08 MED ORDER — LACTATED RINGERS IV SOLN: INTRAVENOUS | Status: DC

## 2017-03-08 MED ORDER — IBUPROFEN 600 MG PO TABS
600.0000 mg | ORAL_TABLET | Freq: Four times a day (QID) | ORAL | Status: DC
  Administered 2017-03-09 – 2017-03-11 (×11): 600 mg via ORAL
  Filled 2017-03-08 (×11): qty 1

## 2017-03-08 MED ORDER — NALBUPHINE HCL 10 MG/ML IJ SOLN: 5.0000 mg | Freq: Once | INTRAMUSCULAR | Status: DC | PRN

## 2017-03-08 MED ORDER — SENNOSIDES-DOCUSATE SODIUM 8.6-50 MG PO TABS
2.0000 | ORAL_TABLET | ORAL | Status: DC
  Administered 2017-03-08 – 2017-03-10 (×3): 2 via ORAL
  Filled 2017-03-08 (×3): qty 2

## 2017-03-08 MED ORDER — SIMETHICONE 80 MG PO CHEW
80.0000 mg | CHEWABLE_TABLET | ORAL | Status: DC
Start: 2017-03-09 — End: 2017-03-11
  Administered 2017-03-08 – 2017-03-10 (×3): 80 mg via ORAL
  Filled 2017-03-08 (×3): qty 1

## 2017-03-08 MED ORDER — ACETAMINOPHEN 325 MG PO TABS
650.0000 mg | ORAL_TABLET | ORAL | Status: DC | PRN
  Administered 2017-03-09: 650 mg via ORAL
  Filled 2017-03-08: qty 2

## 2017-03-08 MED ORDER — OXYCODONE HCL 5 MG PO TABS
10.0000 mg | ORAL_TABLET | ORAL | Status: DC | PRN
  Administered 2017-03-09 – 2017-03-11 (×10): 10 mg via ORAL
  Filled 2017-03-08 (×11): qty 2

## 2017-03-08 MED ORDER — SIMETHICONE 80 MG PO CHEW
80.0000 mg | CHEWABLE_TABLET | Freq: Three times a day (TID) | ORAL | Status: DC
  Administered 2017-03-08 – 2017-03-11 (×8): 80 mg via ORAL
  Filled 2017-03-08 (×7): qty 1

## 2017-03-08 MED ORDER — OXYCODONE HCL 5 MG/5ML PO SOLN: 5.0000 mg | Freq: Once | ORAL | Status: DC | PRN

## 2017-03-08 MED ORDER — KETOROLAC TROMETHAMINE 30 MG/ML IJ SOLN
30.0000 mg | Freq: Once | INTRAMUSCULAR | Status: DC | PRN
  Administered 2017-03-08: 30 mg via INTRAVENOUS

## 2017-03-08 MED ORDER — DIPHENHYDRAMINE HCL 25 MG PO CAPS: 25.0000 mg | ORAL_CAPSULE | Freq: Four times a day (QID) | ORAL | Status: DC | PRN

## 2017-03-08 MED ORDER — FENTANYL CITRATE (PF) 100 MCG/2ML IJ SOLN
INTRAMUSCULAR | Status: DC | PRN
  Administered 2017-03-08: 10 ug via INTRAVENOUS

## 2017-03-08 MED ORDER — MAGNESIUM SULFATE 40 G IN LACTATED RINGERS - SIMPLE
2.0000 g/h | INTRAVENOUS | Status: DC
  Administered 2017-03-08: 2 g/h via INTRAVENOUS
  Filled 2017-03-08: qty 40

## 2017-03-08 MED ORDER — PROMETHAZINE HCL 25 MG/ML IJ SOLN: 6.2500 mg | INTRAMUSCULAR | Status: DC | PRN

## 2017-03-08 MED ORDER — HYDROMORPHONE HCL 1 MG/ML IJ SOLN: 0.2500 mg | INTRAMUSCULAR | Status: DC | PRN

## 2017-03-08 MED ORDER — MAGNESIUM SULFATE 40 G IN LACTATED RINGERS - SIMPLE
2.0000 g/h | INTRAVENOUS | Status: AC
  Administered 2017-03-09: 2 g/h via INTRAVENOUS
  Filled 2017-03-08: qty 500
  Filled 2017-03-08: qty 40

## 2017-03-08 MED ORDER — FERROUS SULFATE 325 (65 FE) MG PO TABS
325.0000 mg | ORAL_TABLET | Freq: Two times a day (BID) | ORAL | Status: DC
  Administered 2017-03-09 – 2017-03-11 (×5): 325 mg via ORAL
  Filled 2017-03-08 (×5): qty 1

## 2017-03-08 MED ORDER — ONDANSETRON HCL 4 MG/2ML IJ SOLN
INTRAMUSCULAR | Status: AC
  Filled 2017-03-08: qty 2

## 2017-03-08 MED ORDER — OXYTOCIN 10 UNIT/ML IJ SOLN
INTRAVENOUS | Status: DC | PRN
  Administered 2017-03-08: 40 [IU] via INTRAVENOUS

## 2017-03-08 MED ORDER — CEFAZOLIN SODIUM-DEXTROSE 2-4 GM/100ML-% IV SOLN
2.0000 g | INTRAVENOUS | Status: AC
  Administered 2017-03-08: 2 g via INTRAVENOUS
  Filled 2017-03-08: qty 100

## 2017-03-08 MED ORDER — SIMETHICONE 80 MG PO CHEW
80.0000 mg | CHEWABLE_TABLET | ORAL | Status: DC | PRN
  Filled 2017-03-08: qty 1

## 2017-03-08 MED ORDER — NALOXONE HCL 0.4 MG/ML IJ SOLN: 0.4000 mg | INTRAMUSCULAR | Status: DC | PRN

## 2017-03-08 MED ORDER — HYDRALAZINE HCL 20 MG/ML IJ SOLN: 10.0000 mg | Freq: Once | INTRAMUSCULAR | Status: DC | PRN

## 2017-03-08 MED ORDER — MORPHINE SULFATE (PF) 0.5 MG/ML IJ SOLN
INTRAMUSCULAR | Status: AC
  Filled 2017-03-08: qty 10

## 2017-03-08 MED ORDER — KETOROLAC TROMETHAMINE 30 MG/ML IJ SOLN
INTRAMUSCULAR | Status: AC
  Administered 2017-03-08: 30 mg via INTRAVENOUS
  Filled 2017-03-08: qty 1

## 2017-03-08 MED ORDER — MORPHINE SULFATE (PF) 0.5 MG/ML IJ SOLN
INTRAMUSCULAR | Status: DC | PRN
  Administered 2017-03-08: .2 mg via INTRATHECAL

## 2017-03-08 MED ORDER — COCONUT OIL OIL
1.0000 "application " | TOPICAL_OIL | Status: DC | PRN
  Administered 2017-03-10: 1 via TOPICAL
  Filled 2017-03-08: qty 120

## 2017-03-08 MED ORDER — BUPIVACAINE IN DEXTROSE 0.75-8.25 % IT SOLN
INTRATHECAL | Status: AC
  Filled 2017-03-08: qty 2

## 2017-03-08 MED ORDER — DIBUCAINE 1 % RE OINT: 1.0000 "application " | TOPICAL_OINTMENT | RECTAL | Status: DC | PRN

## 2017-03-08 MED ORDER — MEPERIDINE HCL 25 MG/ML IJ SOLN: 6.2500 mg | INTRAMUSCULAR | Status: DC | PRN

## 2017-03-08 MED ORDER — OXYCODONE HCL 5 MG PO TABS
5.0000 mg | ORAL_TABLET | ORAL | Status: DC | PRN
  Administered 2017-03-08 – 2017-03-09 (×2): 5 mg via ORAL
  Filled 2017-03-08: qty 1

## 2017-03-08 MED ORDER — SCOPOLAMINE 1 MG/3DAYS TD PT72: 1.0000 | MEDICATED_PATCH | Freq: Once | TRANSDERMAL | Status: DC

## 2017-03-08 MED ORDER — WITCH HAZEL-GLYCERIN EX PADS: 1.0000 "application " | MEDICATED_PAD | CUTANEOUS | Status: DC | PRN

## 2017-03-08 MED ORDER — ONDANSETRON HCL 4 MG/2ML IJ SOLN
INTRAMUSCULAR | Status: DC | PRN
  Administered 2017-03-08: 4 mg via INTRAVENOUS

## 2017-03-08 MED ORDER — ONDANSETRON HCL 4 MG/2ML IJ SOLN: 4.0000 mg | Freq: Three times a day (TID) | INTRAMUSCULAR | Status: DC | PRN

## 2017-03-08 MED ORDER — MENTHOL 3 MG MT LOZG: 1.0000 | LOZENGE | OROMUCOSAL | Status: DC | PRN

## 2017-03-08 MED ORDER — MAGNESIUM SULFATE BOLUS VIA INFUSION
4.0000 g | Freq: Once | INTRAVENOUS | Status: AC
  Administered 2017-03-08: 4 g via INTRAVENOUS
  Filled 2017-03-08: qty 500

## 2017-03-08 SURGICAL SUPPLY — 43 items
APL SKNCLS STERI-STRIP NONHPOA (GAUZE/BANDAGES/DRESSINGS) ×1
BARRIER ADHS 3X4 INTERCEED (GAUZE/BANDAGES/DRESSINGS) ×2 IMPLANT
BENZOIN TINCTURE PRP APPL 2/3 (GAUZE/BANDAGES/DRESSINGS) ×3 IMPLANT
BRR ADH 4X3 ABS CNTRL BYND (GAUZE/BANDAGES/DRESSINGS) ×1
CHLORAPREP W/TINT 26ML (MISCELLANEOUS) ×3 IMPLANT
CLAMP CORD UMBIL (MISCELLANEOUS) IMPLANT
CLOSURE STERI STRIP 1/2 X4 (GAUZE/BANDAGES/DRESSINGS) ×2 IMPLANT
CLOTH BEACON ORANGE TIMEOUT ST (SAFETY) ×3 IMPLANT
CONTAINER PREFILL 10% NBF 15ML (MISCELLANEOUS) IMPLANT
DRSG OPSITE POSTOP 4X10 (GAUZE/BANDAGES/DRESSINGS) ×3 IMPLANT
ELECT REM PT RETURN 9FT ADLT (ELECTROSURGICAL) ×3
ELECTRODE REM PT RTRN 9FT ADLT (ELECTROSURGICAL) ×1 IMPLANT
EXTRACTOR VACUUM KIWI (MISCELLANEOUS) IMPLANT
GLOVE BIOGEL M 6.5 STRL (GLOVE) ×6 IMPLANT
GLOVE BIOGEL PI IND STRL 6.5 (GLOVE) ×1 IMPLANT
GLOVE BIOGEL PI IND STRL 7.0 (GLOVE) ×1 IMPLANT
GLOVE BIOGEL PI INDICATOR 6.5 (GLOVE) ×2
GLOVE BIOGEL PI INDICATOR 7.0 (GLOVE) ×2
GOWN STRL REUS W/TWL LRG LVL3 (GOWN DISPOSABLE) ×9 IMPLANT
HEMOSTAT ARISTA ABSORB 3G PWDR (MISCELLANEOUS) ×4 IMPLANT
KIT ABG SYR 3ML LUER SLIP (SYRINGE) IMPLANT
NDL HYPO 25X5/8 SAFETYGLIDE (NEEDLE) IMPLANT
NEEDLE HYPO 25X5/8 SAFETYGLIDE (NEEDLE) IMPLANT
NS IRRIG 1000ML POUR BTL (IV SOLUTION) ×3 IMPLANT
PACK C SECTION WH (CUSTOM PROCEDURE TRAY) ×3 IMPLANT
PAD ABD 8X7 1/2 STERILE (GAUZE/BANDAGES/DRESSINGS) ×2 IMPLANT
PAD OB MATERNITY 4.3X12.25 (PERSONAL CARE ITEMS) ×3 IMPLANT
PENCIL SMOKE EVAC W/HOLSTER (ELECTROSURGICAL) ×3 IMPLANT
RTRCTR C-SECT PINK 25CM LRG (MISCELLANEOUS) IMPLANT
SPONGE LAP 18X18 X RAY DECT (DISPOSABLE) ×2 IMPLANT
STRIP CLOSURE SKIN 1/2X4 (GAUZE/BANDAGES/DRESSINGS) ×2 IMPLANT
SUT PDS AB 0 CT1 27 (SUTURE) ×6 IMPLANT
SUT PLAIN 0 NONE (SUTURE) IMPLANT
SUT VIC AB 0 CTX 36 (SUTURE) ×9
SUT VIC AB 0 CTX36XBRD ANBCTRL (SUTURE) ×3 IMPLANT
SUT VIC AB 2-0 CT1 (SUTURE) ×2 IMPLANT
SUT VIC AB 2-0 CT1 27 (SUTURE) ×3
SUT VIC AB 2-0 CT1 TAPERPNT 27 (SUTURE) ×1 IMPLANT
SUT VIC AB 3-0 SH 27 (SUTURE)
SUT VIC AB 3-0 SH 27X BRD (SUTURE) IMPLANT
SUT VIC AB 4-0 KS 27 (SUTURE) ×3 IMPLANT
TOWEL OR 17X24 6PK STRL BLUE (TOWEL DISPOSABLE) ×3 IMPLANT
TRAY FOLEY BAG SILVER LF 14FR (SET/KITS/TRAYS/PACK) ×3 IMPLANT

## 2017-03-08 NOTE — Anesthesia Procedure Notes (Signed)
Spinal  Patient location during procedure: OB Start time: 03/08/2017 4:53 PM End time: 03/08/2017 4:58 PM Staffing Anesthesiologist: Lowella CurbMiller, Shon Mansouri Ray, MD Performed: anesthesiologist  Preanesthetic Checklist Completed: patient identified, surgical consent, pre-op evaluation, timeout performed, IV checked, risks and benefits discussed and monitors and equipment checked Spinal Block Patient position: sitting Prep: Betadine and site prepped and draped Patient monitoring: heart rate, cardiac monitor, continuous pulse ox and blood pressure Approach: midline Location: L3-4 Injection technique: single-shot Needle Needle type: Pencan  Needle gauge: 24 G Needle length: 10 cm Assessment Sensory level: T4

## 2017-03-08 NOTE — H&P (Signed)
Melinda [name] is a 37 y.o. female presenting for  Repeat cesarean section due to preeclampsia with severe features at 36 wks and 3 days. Pt was seen in the office today for routine visit Her bp was 160/98. She is complaining of headache that will not resolve with fioricet since yesterday and ruq pain. She has a h/o cesarean section x 2 and HELLP syndrome with prior pregnancies. +FM no lof no vaginal bleeding.    OB History    Gravida Para Term Preterm AB Living   4 2 1 1 1 2    SAB TAB Ectopic Multiple Live Births         0 2     Past Medical History:  Diagnosis Date  . Hypertension    Gestational HTN - Postpartum Mag  . Vaginal Pap smear, abnormal    ASCUS   Past Surgical History:  Procedure Laterality Date  . BREAST SURGERY     implants  . CESAREAN SECTION N/A 08/20/2013   Procedure: CESAREAN SECTION;  Surgeon: Dorien Chihuahuaara J. Richardson Doppole, MD;  Location: [facility];  Service: Obstetrics;  Laterality: N/A;  . CESAREAN SECTION N/A 02/08/2015   Procedure: CESAREAN SECTION;  Surgeon: Gerald Leitzara Montrey Buist, MD;  Location: [facility];  Service: Obstetrics;  Laterality: N/A;   Family History: family history includes Alcohol abuse in her father; Cancer in her maternal grandmother and paternal grandmother; Varicose Veins in her maternal grandmother. Social History:  reports that she quit smoking about 7 years ago. she has never used smokeless tobacco. She reports that she does not drink alcohol or use drugs.     Maternal Diabetes: No Genetic Screening: Normal Maternal Ultrasounds/Referrals: Normal Fetal Ultrasounds or other Referrals:  None Maternal Substance Abuse:  No Significant Maternal Medications:  None Significant Maternal Lab Results:  Lab values include: Group B Strep negative Other Comments:  None  Review of Systems  Constitutional: Negative.   HENT: Negative.   Eyes: Negative.   Respiratory: Negative.   Cardiovascular: Negative.   Gastrointestinal: Negative.   Genitourinary: Negative.    Musculoskeletal: Negative.   Skin: Negative.   Neurological: Negative.   Endo/Heme/Allergies: Negative.   Psychiatric/Behavioral: Negative.    History   Blood pressure 139/85, pulse 65, temperature 98.4 F (36.9 C), temperature source Oral, resp. rate 18, height 5\' 6"  (1.676 m), weight 81.6 kg (180 lb), last menstrual period 06/26/2016, unknown if currently breastfeeding. Exam Physical Exam  Vitals reviewed. Constitutional: She is oriented to person, place, and time. She appears well-nourished.  HENT:  Head: Normocephalic and atraumatic.  Eyes: Pupils are equal, round, and reactive to light.  Neck: Normal range of motion. Neck supple.  Cardiovascular: Normal rate and regular rhythm.  Respiratory: Effort normal and breath sounds normal.  GI: Bowel sounds are normal. There is no tenderness.  Musculoskeletal: Normal range of motion. She exhibits no edema.  Neurological: She is alert and oriented to person, place, and time. She displays abnormal reflex.  Skin: Skin is warm and dry.  Psychiatric: She has a normal mood and affect.    Prenatal labs: ABO, Rh: --/--/A POS (01/10 1206) Antibody: NEG (01/10 1206) Rubella:  Immune  RPR: Non Reactive (01/03 1428)  HBsAg:   Negative  HIV:   Negative  GBS:   Negative   Assessment/Plan: 36 wks and 3 days with severe preeclampsia based on elevated bp and Neurologic symptoms.  H/o Cesarean section. Recommend repeat cesarean section. R/b/a discussed with patient and she desires to proceed.  Plan for magnesium sulfate  to prevent seizure prophylaxis.    [name] J. 03/08/2017, 2:55 PM

## 2017-03-08 NOTE — Anesthesia Postprocedure Evaluation (Signed)
Anesthesia Post Note  Patient: Melinda [name]  Procedure(s) Performed: CESAREAN SECTION (N/A )     Patient location during evaluation: PACU Anesthesia Type: Spinal Level of consciousness: awake and alert Pain management: pain level controlled Vital Signs Assessment: post-procedure vital signs reviewed and stable Respiratory status: spontaneous breathing, nonlabored ventilation, respiratory function stable and patient connected to nasal cannula oxygen Cardiovascular status: blood pressure returned to baseline and stable Postop Assessment: no apparent nausea or vomiting Anesthetic complications: no    Last Vitals:  Vitals:   03/08/17 1845 03/08/17 1900  BP: 129/72 126/65  Pulse: (!) 58 (!) 52  Resp: 16 12  Temp:  36.8 C  SpO2: 100% 100%    Last Pain:  Vitals:   03/08/17 1900  TempSrc: Oral  PainSc: 2                  Lowella CurbWarren Ray Mclain Freer

## 2017-03-08 NOTE — H&P (Signed)
Date of Initial H&P: 03/08/2017  History reviewed, patient examined, no change in status, stable for surgery.

## 2017-03-08 NOTE — Transfer of Care (Signed)
Immediate Anesthesia Transfer of Care Note  Patient: Melinda BoozeJessica S [name]  Procedure(s) Performed: CESAREAN SECTION (N/A )  Patient Location: PACU  Anesthesia Type:Spinal  Level of Consciousness: awake  Airway & Oxygen Therapy: Patient Spontanous Breathing  Post-op Assessment: Report given to RN  Post vital signs: Reviewed and stable  Last Vitals:  Vitals:   03/08/17 1620 03/08/17 1640  BP: 134/64 129/69  Pulse: 70 69  Resp:    Temp:    SpO2:      Last Pain:  Vitals:   03/08/17 1540  TempSrc:   PainSc: Asleep         Complications: No apparent anesthesia complications

## 2017-03-08 NOTE — Op Note (Signed)
Cesarean Section Procedure Note  Indications: severe preeclampsia  Pre-operative Diagnosis: 36 week 3 day pregnancy.  Post-operative Diagnosis: same  Surgeon: Jessee AversOLE,Nicolus Ose J.   Assistants: Dr. Myna HidalgoJennifer Ozan assisted due to concern for adhesive disease   Anesthesia: Spinal anesthesia  ASA Class: 2   Procedure Details   The patient was seen in the Holding Room. The risks, benefits, complications, treatment options, and expected outcomes were discussed with the patient.  The patient concurred with the proposed plan, giving informed consent.  The site of surgery properly noted/marked. The patient was taken to Operating Room # 9, identified as Melinda [name] and the procedure verified as C-Section Delivery. A Time Out was held and the above information confirmed.  After induction of anesthesia, the patient was draped and prepped in the usual sterile manner. A Pfannenstiel incision was made and carried down through the subcutaneous tissue to the fascia. Fascial incision was made and extended transversely. The fascia was separated from the underlying rectus tissue superiorly and inferiorly. The peritoneum was identified and entered. Peritoneal incision was extended longitudinally. The utero-vesical peritoneal reflection was incised transversely and the bladder flap was bluntly freed from the lower uterine segment. A low transverse uterine incision was made. Delivered from cephalic presentation was a  Female with Apgar scores of 9 at one minute and 9 at five minutes. After the umbilical cord was clamped and cut cord blood was obtained for evaluation. The placenta was removed intact and appeared normal. The uterine outline, tubes and ovaries appeared normal. The uterine incision was closed with running locked sutures of 0 vicryl. A second layer of 0 vicryl was used to imbricate the incision. . Hemostasis was observed. Lavage was carried out until clear. Interseed was placed along the uterine incision.  Pt  had oozing from the rectus muscles . Arista was applied.  The fascia was then reapproximated with running sutures of 0 pds. The skin was reapproximated with 4-0 vicry..  Instrument, sponge, and needle counts were correct prior the abdominal closure and at the conclusion of the case.   Findings:  normal fallopian tubes and ovaries. Female infant in cephalic presentation. Very thin lower uterine segment   Estimated Blood Loss:  800 mL         Drains: none         Total IV Fluids:   Per anesthesia ml         Specimens: Placenta and Disposition:  Sent to Pathology          Implants: None         Complications:  None; patient tolerated the procedure well.         Disposition: PACU - hemodynamically stable.         Condition: stable  Attending Attestation: I performed the procedure.

## 2017-03-08 NOTE — Anesthesia Preprocedure Evaluation (Signed)
Anesthesia Evaluation  Patient identified by MRN, date of birth, ID band Patient awake    Reviewed: Allergy & Precautions, NPO status , Patient's Chart, lab work & pertinent test results  History of Anesthesia Complications Negative for: history of anesthetic complications  Airway Mallampati: II  TM Distance: >3 FB Neck ROM: Full    Dental no notable dental hx. (+) Dental Advisory Given   Pulmonary former smoker,    Pulmonary exam normal breath sounds clear to auscultation       Cardiovascular hypertension, Normal cardiovascular exam Rhythm:Regular Rate:Normal     Neuro/Psych negative neurological ROS  negative psych ROS   GI/Hepatic negative GI ROS, Neg liver ROS,   Endo/Other  obesity  Renal/GU negative Renal ROS  negative genitourinary   Musculoskeletal negative musculoskeletal ROS (+)   Abdominal   Peds negative pediatric ROS (+)  Hematology negative hematology ROS (+)   Anesthesia Other Findings   Reproductive/Obstetrics (+) Pregnancy                             Anesthesia Physical  Anesthesia Plan  ASA: III  Anesthesia Plan: Spinal   Post-op Pain Management:    Induction: Intravenous  PONV Risk Score and Plan: 2 and Treatment may vary due to age or medical condition  Airway Management Planned: Natural Airway  Additional Equipment:   Intra-op Plan:   Post-operative Plan:   Informed Consent: I have reviewed the patients History and Physical, chart, labs and discussed the procedure including the risks, benefits and alternatives for the proposed anesthesia with the patient or authorized representative who has indicated his/her understanding and acceptance.   Dental advisory given  Plan Discussed with: CRNA  Anesthesia Plan Comments:         Anesthesia Quick Evaluation

## 2017-03-09 LAB — CBC
HEMATOCRIT: 32.6 % — AB (ref 36.0–46.0)
HEMOGLOBIN: 11.4 g/dL — AB (ref 12.0–15.0)
MCH: 32.4 pg (ref 26.0–34.0)
MCHC: 35 g/dL (ref 30.0–36.0)
MCV: 92.6 fL (ref 78.0–100.0)
Platelets: 107 10*3/uL — ABNORMAL LOW (ref 150–400)
RBC: 3.52 MIL/uL — ABNORMAL LOW (ref 3.87–5.11)
RDW: 13.4 % (ref 11.5–15.5)
WBC: 9.7 10*3/uL (ref 4.0–10.5)

## 2017-03-09 LAB — COMPREHENSIVE METABOLIC PANEL
ALBUMIN: 2.4 g/dL — AB (ref 3.5–5.0)
ALT: 43 U/L (ref 14–54)
ANION GAP: 6 (ref 5–15)
AST: 58 U/L — AB (ref 15–41)
Alkaline Phosphatase: 86 U/L (ref 38–126)
BILIRUBIN TOTAL: 0.5 mg/dL (ref 0.3–1.2)
BUN: 15 mg/dL (ref 6–20)
CHLORIDE: 104 mmol/L (ref 101–111)
CO2: 25 mmol/L (ref 22–32)
Calcium: 7.3 mg/dL — ABNORMAL LOW (ref 8.9–10.3)
Creatinine, Ser: 0.67 mg/dL (ref 0.44–1.00)
GFR calc Af Amer: >60 mL/min (ref >60)
GFR calc non Af Amer: >60 mL/min (ref >60)
GLUCOSE: 98 mg/dL (ref 65–99)
POTASSIUM: 4.4 mmol/L (ref 3.5–5.1)
SODIUM: 135 mmol/L (ref 135–145)
TOTAL PROTEIN: 5.5 g/dL — AB (ref 6.5–8.1)

## 2017-03-09 LAB — RPR: RPR Ser Ql: NONREACTIVE

## 2017-03-09 LAB — MAGNESIUM: Magnesium: 5.3 mg/dL — ABNORMAL HIGH (ref 1.7–2.4)

## 2017-03-09 NOTE — Progress Notes (Signed)
Subjective: Postop Day 1: Cesarean Delivery No complaints.  Pain controlled.  Lochia normal.  Breast feeding yes. Denies SOB or visual changes.  Pt has a headache.  Got medication within the last hour.  Objective: Temp:  [97.5 F (36.4 C)-98.4 F (36.9 C)] 98.2 F (36.8 C) (01/11 0435) Pulse Rate:  [50-73] 63 (01/11 0435) Resp:  [12-19] 15 (01/11 0715) BP: (112-143)/(64-96) 114/70 (01/11 0435) SpO2:  [98 %-100 %] 98 % (01/11 0435) Weight:  [81.6 kg (180 lb)-81.9 kg (180 lb 8 oz)] 81.6 kg (180 lb) (01/10 1214) UOP 550/650 starting at 0400  Physical Exam: Gen: NAD Lochia: Not visualized Uterine Fundus: firm, appropriately tender Incision: clean, dry and intact, healing well DVT Evaluation: SCDs in place.   CBC    Component Value Date/Time   WBC 9.7 03/09/2017 0539   RBC 3.52 (L) 03/09/2017 0539   HGB 11.4 (L) 03/09/2017 0539   HGB 13.4 06/30/2012 1226   HCT 32.6 (L) 03/09/2017 0539   HCT 38.5 06/30/2012 1226   PLT 107 (L) 03/09/2017 0539   PLT 243 06/30/2012 1226   MCV 92.6 03/09/2017 0539   MCV 92 06/30/2012 1226   MCH 32.4 03/09/2017 0539   MCHC 35.0 03/09/2017 0539   RDW 13.4 03/09/2017 0539   RDW 12.7 06/30/2012 1226   LYMPHSABS 1.6 02/09/2015 0545   MONOABS 1.4 (H) 02/09/2015 0545   EOSABS 0.0 02/09/2015 0545   BASOSABS 0.0 02/09/2015 0545   CMP     Component Value Date/Time   NA 135 03/09/2017 0539   NA 140 06/30/2012 1226   K 4.4 03/09/2017 0539   K 3.6 06/30/2012 1226   CL 104 03/09/2017 0539   CL 107 06/30/2012 1226   CO2 25 03/09/2017 0539   CO2 24 06/30/2012 1226   GLUCOSE 98 03/09/2017 0539   GLUCOSE 120 (H) 06/30/2012 1226   BUN 15 03/09/2017 0539   BUN 10 06/30/2012 1226   CREATININE 0.67 03/09/2017 0539   CREATININE 0.59 (L) 06/30/2012 1226   CALCIUM 7.3 (L) 03/09/2017 0539   CALCIUM 9.1 06/30/2012 1226   PROT 5.5 (L) 03/09/2017 0539   PROT 7.1 06/30/2012 1226   ALBUMIN 2.4 (L) 03/09/2017 0539   ALBUMIN 3.9 06/30/2012 1226   AST 58  (H) 03/09/2017 0539   AST 31 06/30/2012 1226   ALT 43 03/09/2017 0539   ALT 41 06/30/2012 1226   ALKPHOS 86 03/09/2017 0539   ALKPHOS 61 06/30/2012 1226   BILITOT 0.5 03/09/2017 0539   BILITOT 0.8 06/30/2012 1226   GFRNONAA >60 03/09/2017 0539   GFRNONAA >60 06/30/2012 1226   GFRAA >60 03/09/2017 0539   GFRAA >60 06/30/2012 1226   Magnesium 5.4  Assessment/Plan: Status post C-section-doing well postoperatively. Preeclampsia h/o HELLP syndrome x 2.  On Magnesium; no s/sxs of toxicity.  Level is therapeutic. BP and Labs stable, normal. Discontinue Magnesium at 1830. Strict I/Os. Lactation support. Plan for circumcision today or tomorrow depending on feedings due to prematurity.     Melinda [name] 03/09/2017, 7:37 AM

## 2017-03-09 NOTE — Addendum Note (Signed)
Addendum  created 03/09/17 0846 by Yolonda Kidaarver, Lizann Edelman L, CRNA   Sign clinical note

## 2017-03-09 NOTE — Anesthesia Postprocedure Evaluation (Signed)
Anesthesia Post Note  Patient: Melinda [name]  Procedure(s) Performed: CESAREAN SECTION (N/A )     Patient location during evaluation: [facility] Anesthesia Type: Spinal Level of consciousness: awake, awake and alert, oriented and patient cooperative Pain management: pain level controlled Vital Signs Assessment: post-procedure vital signs reviewed and stable Respiratory status: spontaneous breathing, nonlabored ventilation and respiratory function stable Cardiovascular status: stable Postop Assessment: no headache, no backache, patient able to bend at knees and no apparent nausea or vomiting Anesthetic complications: no    Last Vitals:  Vitals:   03/09/17 0618 03/09/17 0715  BP:    Pulse:    Resp: 16 15  Temp:    SpO2:      Last Pain:  Vitals:   03/09/17 0715  TempSrc:   PainSc: 0-No pain   Pain Goal: Patients Stated Pain Goal: 2 (03/09/17 0030)               Anadelia Kintz L

## 2017-03-09 NOTE — Lactation Note (Signed)
This note was copied from a baby's chart. Lactation Consultation Note  Patient Name: Boy Doreene NestJessica Holquin ZOXWR'UToday's Date: 03/09/2017 Reason for consult: Initial assessment;Infant < 6lbs;Late-preterm 34-36.6wks  Visited with P3 Mom of 7841w3d baby weighing <6lbs. 2% weight loss.  Output good.  Baby has been consistently supplemented using 22 calorie formula 10-15 ml using curved tip syringe. Mom has been pumping after breastfeeding using her DEBP (Spectra) from home.  Offered to set up Symphony DEBP, but Mom declined. Mom instructed on hand expression, return demonstration done.  Mom unable to attain more than a couple drops currently.  Reassured her that this was normal.  Baby does go to breast at least every 3 hrs on cue.  Some feedings, baby stays on and feed for several minutes.  But reassured parents that baby may become more sleepy 2nd day of life. Lactation brochure given to Mom, and informed her of OP lactation services available to her.  LPTI handout reviewed. Encouraged Mom to call prn.  Consult Status Consult Status: Follow-up Date: 03/10/17 Follow-up type: In-patient    Judee ClaraSmith, Josua Ferrebee E 03/09/2017, 5:09 PM

## 2017-03-10 LAB — CBC
HEMATOCRIT: 30.1 % — AB (ref 36.0–46.0)
Hemoglobin: 10.5 g/dL — ABNORMAL LOW (ref 12.0–15.0)
MCH: 33 pg (ref 26.0–34.0)
MCHC: 34.9 g/dL (ref 30.0–36.0)
MCV: 94.7 fL (ref 78.0–100.0)
Platelets: 118 10*3/uL — ABNORMAL LOW (ref 150–400)
RBC: 3.18 MIL/uL — ABNORMAL LOW (ref 3.87–5.11)
RDW: 14 % (ref 11.5–15.5)
WBC: 12.5 10*3/uL — ABNORMAL HIGH (ref 4.0–10.5)

## 2017-03-10 LAB — COMPREHENSIVE METABOLIC PANEL
ALBUMIN: 2.3 g/dL — AB (ref 3.5–5.0)
ALK PHOS: 82 U/L (ref 38–126)
ALT: 30 U/L (ref 14–54)
ANION GAP: 7 (ref 5–15)
AST: 26 U/L (ref 15–41)
BILIRUBIN TOTAL: 0.4 mg/dL (ref 0.3–1.2)
BUN: 12 mg/dL (ref 6–20)
CALCIUM: 7.2 mg/dL — AB (ref 8.9–10.3)
CO2: 23 mmol/L (ref 22–32)
Chloride: 106 mmol/L (ref 101–111)
Creatinine, Ser: 0.68 mg/dL (ref 0.44–1.00)
GFR calc Af Amer: >60 mL/min (ref >60)
GLUCOSE: 86 mg/dL (ref 65–99)
Potassium: 4.1 mmol/L (ref 3.5–5.1)
Sodium: 136 mmol/L (ref 135–145)
TOTAL PROTEIN: 5 g/dL — AB (ref 6.5–8.1)

## 2017-03-10 MED ORDER — ACETAMINOPHEN 325 MG PO TABS: 650.0000 mg | ORAL_TABLET | Freq: Four times a day (QID) | ORAL | Status: DC | PRN

## 2017-03-10 NOTE — Lactation Note (Signed)
This note was copied from a baby's chart. Lactation Consultation Note; Experienced BF mom reports baby is nursing well. They continue supplementing with curved tip syringe. Mom reports mature milk is starting to come in this morning. Using her own Spectra pump. No questions at present. No questions at present. To call prn  Patient Name: Melinda [name] XBJYN'WToday's Date: 03/10/2017 Reason for consult: Follow-up assessment;Late-preterm 34-36.6wks   Maternal Data    Feeding LATCH Score                   Interventions    Lactation Tools Discussed/Used     Consult Status Consult Status: PRN    Pamelia HoitWeeks, Carlissa Pesola D 03/10/2017, 11:06 AM

## 2017-03-10 NOTE — Progress Notes (Addendum)
Subjective: Postpartum Day 2: Cesarean Delivery repeat Patient reports + flatus and no problems voiding.  Up ambulating.  Having some pain.    Objective: Vital signs in last 24 hours: Temp:  [98 F (36.7 C)-100 F (37.8 C)] 98.3 F (36.8 C) (01/12 0410) Pulse Rate:  [58-73] 58 (01/12 0410) Resp:  [16-17] 17 (01/12 0410) BP: (112-133)/(60-76) 121/60 (01/12 0410) SpO2:  [94 %-98 %] 98 % (01/12 0410) Weight:  [81.3 kg (179 lb 3.2 oz)-81.7 kg (180 lb 0.1 oz)] 81.7 kg (180 lb 0.1 oz) (01/12 0500)  Physical Exam:  General: alert, cooperative and no distress Lochia: appropriate Uterine Fundus: firm Incision: healing well  Honey comb off.  DVT Evaluation: No evidence of DVT seen on physical exam. Negative Homan's sign.  Recent Labs    03/09/17 0539 03/10/17 0516  HGB 11.4* 10.5*  HCT 32.6* 30.1*    Assessment/Plan: Status post Cesarean section. Doing well postoperatively. Plan on discharge tomorrow.  Continue current care.  BP wnl.  Labs improving.   Apply new honey comb dressing.  Henderson Newcomerancy Jean Unknown Schleyer 03/10/2017, 7:39 AM

## 2017-03-10 NOTE — Progress Notes (Signed)
New honeycomb applied to surgical incision.

## 2017-03-11 MED ORDER — IBUPROFEN 600 MG PO TABS: 600.0000 mg | ORAL_TABLET | Freq: Four times a day (QID) | ORAL | 0 refills | Status: DC

## 2017-03-11 MED ORDER — OXYCODONE HCL 5 MG PO TABS: 5.0000 mg | ORAL_TABLET | ORAL | 0 refills | Status: DC | PRN

## 2017-03-11 NOTE — Discharge Summary (Signed)
OB Discharge Summary     Patient Name: Melinda [name] DOB: 07/12/1980 MRN: [id number]  Date of admission: 03/08/2017 Delivering MD: Gerald Leitz   Date of discharge: 03/11/2017  Admitting diagnosis: rpt c-s, PIH with severe features Intrauterine pregnancy: [redacted]w[redacted]d     Secondary diagnosis:  Active Problems:   Preeclampsia, severe, third trimester   Severe preeclampsia, third trimester  Additional problems:      Discharge diagnosis: Preterm Pregnancy Delivered and Preeclampsia (severe)                                                                                                Post partum procedures:  Augmentation:   Complications: None  Hospital course:  Sceduled C/S   37 y.o. yo [id number] at [redacted]w[redacted]d was admitted to the hospital 03/08/2017 for scheduled cesarean section with the following indication:Elective Repeat, Prior Uterine Surgery and severe preeclampsia.  Membrane Rupture Time/Date: 5:21 PM ,03/08/2017   Patient delivered a Viable infant.03/08/2017  Details of operation can be found in separate operative note.  Pateint had an uncomplicated postpartum course.  She is ambulating, tolerating a regular diet, passing flatus, and urinating well. Patient is discharged home in stable condition on  03/11/17         Physical exam  Vitals:   03/10/17 1630 03/10/17 2055 03/10/17 2341 03/11/17 0345  BP: 126/60 128/68 132/74 124/78  Pulse: 70 70 76 (!) 58  Resp: 18 18 17 17   Temp: 98 F (36.7 C) 98.2 F (36.8 C) 98.8 F (37.1 C) 98.3 F (36.8 C)  TempSrc: Oral Oral  Oral  SpO2: 99% 98% 99% 100%  Weight:      Height:       General: alert, cooperative and no distress Lochia: appropriate Uterine Fundus: firm Incision: Dressing is clean, dry, and intact DVT Evaluation: No evidence of DVT seen on physical exam. Labs: Lab Results  Component Value Date   WBC 12.5 (H) 03/10/2017   HGB 10.5 (L) 03/10/2017   HCT 30.1 (L) 03/10/2017   MCV 94.7 03/10/2017   PLT 118 (L) 03/10/2017    CMP Latest Ref Rng & Units 03/10/2017  Glucose 65 - 99 mg/dL 86  BUN 6 - 20 mg/dL 12  Creatinine 1.47 - 8.29 mg/dL 5.62  Sodium 130 - 865 mmol/L 136  Potassium 3.5 - 5.1 mmol/L 4.1  Chloride 101 - 111 mmol/L 106  CO2 22 - 32 mmol/L 23  Calcium 8.9 - 10.3 mg/dL 7.2(L)  Total Protein 6.5 - 8.1 g/dL 5.0(L)  Total Bilirubin 0.3 - 1.2 mg/dL 0.4  Alkaline Phos 38 - 126 U/L 82  AST 15 - 41 U/L 26  ALT 14 - 54 U/L 30    Discharge instruction: per After Visit Summary and "Baby and Me Booklet".  After visit meds:  Allergies as of 03/11/2017   No Known Allergies     Medication List    STOP taking these medications   aspirin EC 81 MG tablet   butalbital-acetaminophen-caffeine 50-325-40 MG tablet Commonly known as:  FIORICET, ESGIC   multivitamin with minerals Tabs tablet  TAKE these medications   [employer] EASY FIBER/CALCIUM Chew Chew 2 each by mouth daily.   ibuprofen 600 MG tablet Commonly known as:  ADVIL,MOTRIN Take 1 tablet (600 mg total) by mouth every 6 (six) hours.   oxyCODONE 5 MG immediate release tablet Commonly known as:  Oxy IR/ROXICODONE Take 1 tablet (5 mg total) by mouth every 4 (four) hours as needed (pain scale 4-7).   prenatal multivitamin Tabs tablet Take 1 tablet by mouth daily.       Diet: routine diet  Activity: Advance as tolerated. Pelvic rest for 6 weeks.   Outpatient follow up:1 week Follow up Appt:No future appointments. Follow up Visit:No Follow-up on file.  Postpartum contraception: Not Discussed  Newborn Data: Live born female  Birth Weight: 5 lb 12.8 oz (2630 g) APGAR: 9, 9  Newborn Delivery   Birth date/time:  03/08/2017 17:22:00 Delivery type:  C-Section, Low Transverse C-section categorization:  Repeat     Baby Feeding: Breast Disposition:home with mother   03/11/2017 Melinda [name], CNM

## 2017-03-11 NOTE — Lactation Note (Signed)
This note was copied from a baby's chart. Lactation Consultation Note  Patient Name: Boy Doreene NestJessica Saez ZOXWR'UToday's Date: 03/11/2017   Visited with Mom on day of discharge, baby at 4% weight loss, increase of .2% from yesterday.   Mom continues to breastfeed, and then supplement with EBM+/formula, volume to increase to 30-60 ml.  Mom expressed 30 ml of EBM this am.  Mom has a DEBP at home.  Mom states that baby breastfed well during the night, 20 mins max with swallowing heard.  But he is sleepy this am.   Encouraged Mom to continue keeping baby STS, and feeding at least every 3 hrs, sooner if baby cues. Mom will continue double pumping, and supplementing with EBM+/formula 30-60 ml.   Mom's breasts are full.  Engorgement prevention and treatment discussed. Offered an OP Lactation appointment.  Mom states her Pediatrician has a Advertising copywriterLactation Consultant that she will see.   Mom aware of OP Lactation services and encouraged to call prn.   Judee ClaraSmith, Haeden Hudock E 03/11/2017, 1:24 PM

## 2017-03-11 NOTE — Discharge Instructions (Signed)
Cesarean Delivery, Care After Refer to this sheet in the next few weeks. These instructions provide you with information about caring for yourself after your procedure. Your health care provider may also give you more specific instructions. Your treatment has been planned according to current medical practices, but problems sometimes occur. Call your health care provider if you have any problems or questions after your procedure. What can I expect after the procedure? After the procedure, it is common to have:  A small amount of blood or clear fluid coming from the incision.  Some redness, swelling, and pain in your incision area.  Some abdominal pain and soreness.  Vaginal bleeding (lochia).  Pelvic cramps.  Fatigue.  Follow these instructions at home: Incision care   Follow instructions from your health care provider about how to take care of your incision. Make sure you: ? Wash your hands with soap and water before you change your bandage (dressing). If soap and water are not available, use hand sanitizer. ? If you have a dressing, change it as told by your health care provider. ? Leave stitches (sutures), skin staples, skin glue, or adhesive strips in place. These skin closures may need to stay in place for 2 weeks or longer. If adhesive strip edges start to loosen and curl up, you may trim the loose edges. Do not remove adhesive strips completely unless your health care provider tells you to do that.  Check your incision area every day for signs of infection. Check for: ? More redness, swelling, or pain. ? More fluid or blood. ? Warmth. ? Pus or a bad smell.  When you cough or sneeze, hug a pillow. This helps with pain and decreases the chance of your incision opening up (dehiscing). Do this until your incision heals. Medicines  Take over-the-counter and prescription medicines only as told by your health care provider.  If you were prescribed an antibiotic medicine, take it  as told by your health care provider. Do not stop taking the antibiotic until it is finished. Driving  Do not drive or operate heavy machinery while taking prescription pain medicine. Lifestyle  Do not drink alcohol. This is especially important if you are breastfeeding or taking pain medicine.  Do not use tobacco products, including cigarettes, chewing tobacco, or e-cigarettes. If you need help quitting, ask your health care provider. Tobacco can delay wound healing. Eating and drinking  Drink at least 8 eight-ounce glasses of water every day unless told not to by your health care provider. If you breastfeed, you may need to drink more water than this.  Eat high-fiber foods every day. These foods may help prevent or relieve constipation. High-fiber foods include: ? Whole grain cereals and breads. ? Brown rice. ? Beans. ? Fresh fruits and vegetables. Activity  Return to your normal activities as told by your health care provider. Ask your health care provider what activities are safe for you.  Rest as much as possible. Try to rest or take a nap while your baby is sleeping.  Do not lift anything that is heavier than your baby or 10 lb (4.5 kg) as told by your health care provider.  Ask your health care provider when you can engage in sexual activity. This may depend on your: ? Risk of infection. ? Healing rate. ? Comfort and desire to engage in sexual activity. Bathing  Do not take baths, swim, or use a hot tub until your health care provider approves. Ask your health care provider if  you can take showers. You may only be allowed to take sponge baths until your incision heals. General instructions  Do not use tampons or douches until your health care provider approves.  Wear: ? Loose, comfortable clothing. ? A supportive and well-fitting bra.  Watch for any blood clots that may pass from your vagina. These may look like clumps of dark red, brown, or black discharge.  Keep  your perineum clean and dry as told by your health care provider.  Wipe from front to back when you use the toilet.  If possible, have someone help you care for your baby and help with household activities for a few days after you leave the hospital.  Keep all follow-up visits for you and your baby as told by your health care provider. This is important. Contact a health care provider if:  You have: ? Bad-smelling vaginal discharge. ? Difficulty urinating. ? Pain when urinating. ? A sudden increase or decrease in the frequency of your bowel movements. ? More redness, swelling, or pain around your incision. ? More fluid or blood coming from your incision. ? Pus or a bad smell coming from your incision. ? A fever. ? A rash. ? Little or no interest in activities you used to enjoy. ? Questions about caring for yourself or your baby. ? Nausea.  Your incision feels warm to the touch.  Your breasts turn red or become painful or hard.  You feel unusually sad or worried.  You vomit.  You pass large blood clots from your vagina. If you pass a blood clot, save it to show to your health care provider. Do not flush blood clots down the toilet without showing your health care provider.  You urinate more than usual.  You are dizzy or light-headed.  You have not breastfed and have not had a menstrual period for 12 weeks after delivery.  You stopped breastfeeding and have not had a menstrual period for 12 weeks after stopping breastfeeding. Get help right away if:  You have: ? Pain that does not go away or get better with medicine. ? Chest pain. ? Difficulty breathing. ? Blurred vision or spots in your vision. ? Thoughts about hurting yourself or your baby. ? New pain in your abdomen or in one of your legs. ? A severe headache.  You faint.  You bleed from your vagina so much that you fill two sanitary pads in one hour. This information is not intended to replace advice given to  you by your health care provider. Make sure you discuss any questions you have with your health care provider. Document Released: 11/05/2001 Document Revised: 03/18/2016 Document Reviewed: 01/18/2015 Elsevier Interactive Patient Education  2018 [employer]. Hypertension During Pregnancy Hypertension, commonly called high blood pressure, is when the force of blood pumping through your arteries is too strong. Arteries are blood vessels that carry blood from the heart throughout the body. Hypertension during pregnancy can cause problems for you and your baby. Your baby may be born early (prematurely) or may not weigh as much as he or she should at birth. Very bad cases of hypertension during pregnancy can be life-threatening. Different types of hypertension can occur during pregnancy. These include:  Chronic hypertension. This happens when: ? You have hypertension before pregnancy and it continues during pregnancy. ? You develop hypertension before you are [redacted] weeks pregnant, and it continues during pregnancy.  Gestational hypertension. This is hypertension that develops after the 20th week of pregnancy.  Preeclampsia, also  called toxemia of pregnancy. This is a very serious type of hypertension that develops only during pregnancy. It affects the whole body, and it can be very dangerous for you and your baby.  Gestational hypertension and preeclampsia usually go away within 6 weeks after your baby is born. Women who have hypertension during pregnancy have a greater chance of developing hypertension later in life or during future pregnancies. What are the causes? The exact cause of hypertension is not known. What increases the risk? There are certain factors that make it more likely for you to develop hypertension during pregnancy. These include:  Having hypertension during a previous pregnancy or prior to pregnancy.  Being overweight.  Being older than age 35.  Being pregnant for the first  time or being pregnant with more than one baby.  Becoming pregnant using fertilization methods such as IVF (in vitro fertilization).  Having diabetes, kidney problems, or systemic lupus erythematosus.  Having a family history of hypertension.  What are the signs or symptoms? Chronic hypertension and gestational hypertension rarely cause symptoms. Preeclampsia causes symptoms, which may include:  Increased protein in your urine. Your health care provider will check for this at every visit before you give birth (prenatal visit).  Severe headaches.  Sudden weight gain.  Swelling of the hands, face, legs, and feet.  Nausea and vomiting.  Vision problems, such as blurred or double vision.  Numbness in the face, arms, legs, and feet.  Dizziness.  Slurred speech.  Sensitivity to bright lights.  Abdominal pain.  Convulsions.  How is this diagnosed? You may be diagnosed with hypertension during a routine prenatal exam. At each prenatal visit, you may:  Have a urine test to check for high amounts of protein in your urine.  Have your blood pressure checked. A blood pressure reading is recorded as two numbers, such as "120 over 80" (or 120/80). The first ("top") number is called the systolic pressure. It is a measure of the pressure in your arteries when your heart beats. The second ("bottom") number is called the diastolic pressure. It is a measure of the pressure in your arteries as your heart relaxes between beats. Blood pressure is measured in a unit called mm Hg. A normal blood pressure reading is: ? Systolic: below 120. ? Diastolic: below 80.  The type of hypertension that you are diagnosed with depends on your test results and when your symptoms developed.  Chronic hypertension is usually diagnosed before 20 weeks of pregnancy.  Gestational hypertension is usually diagnosed after 20 weeks of pregnancy.  Hypertension with high amounts of protein in the urine is diagnosed  as preeclampsia.  Blood pressure measurements that stay above 160 systolic, or above 110 diastolic, are signs of severe preeclampsia.  How is this treated? Treatment for hypertension during pregnancy varies depending on the type of hypertension you have and how serious it is.  If you take medicines called ACE inhibitors to treat chronic hypertension, you may need to switch medicines. ACE inhibitors should not be taken during pregnancy.  If you have gestational hypertension, you may need to take blood pressure medicine.  If you are at risk for preeclampsia, your health care provider may recommend that you take a low-dose aspirin every day to prevent high blood pressure during your pregnancy.  If you have severe preeclampsia, you may need to be hospitalized so you and your baby can be monitored closely. You may also need to take medicine (magnesium sulfate) to prevent seizures and to lower  blood pressure. This medicine may be given as an injection or through an IV tube.  In some cases, if your condition gets worse, you may need to deliver your baby early.  Follow these instructions at home: Eating and drinking  Drink enough fluid to keep your urine clear or pale yellow.  Eat a healthy diet that is low in salt (sodium). Do not add salt to your food. Check food labels to see how much sodium a food or beverage contains. Lifestyle  Do not use any products that contain nicotine or tobacco, such as cigarettes and e-cigarettes. If you need help quitting, ask your health care provider.  Do not use alcohol.  Avoid caffeine.  Avoid stress as much as possible. Rest and get plenty of sleep. General instructions  Take over-the-counter and prescription medicines only as told by your health care provider.  While lying down, lie on your left side. This keeps pressure off your baby.  While sitting or lying down, raise (elevate) your feet. Try putting some pillows under your lower legs.  Exercise  regularly. Ask your health care provider what kinds of exercise are best for you.  Keep all prenatal and follow-up visits as told by your health care provider. This is important. Contact a health care provider if:  You have symptoms that your health care provider told you may require more treatment or monitoring, such as: ? Fever. ? Vomiting. ? Headache. Get help right away if:  You have severe abdominal pain or vomiting that does not get better with treatment.  You suddenly develop swelling in your hands, ankles, or face.  You gain 4 lbs (1.8 kg) or more in 1 week.  You develop vaginal bleeding, or you have blood in your urine.  You do not feel your baby moving as much as usual.  You have blurred or double vision.  You have muscle twitching or sudden tightening (spasms).  You have shortness of breath.  Your lips or fingernails turn blue. This information is not intended to replace advice given to you by your health care provider. Make sure you discuss any questions you have with your health care provider. Document Released: 11/01/2010 Document Revised: 09/03/2015 Document Reviewed: 07/30/2015 Elsevier Interactive Patient Education  [employer].

## 2017-03-11 NOTE — Progress Notes (Signed)

## 2017-03-23 ENCOUNTER — Encounter (HOSPITAL_COMMUNITY)
Admission: RE | Admit: 2017-03-23 | Discharge: 2017-03-23 | Disposition: A | Discharge status: Home / Self Care | Payer: 59 | Source: Ambulatory Visit

## 2017-03-23 NOTE — Addendum Note (Signed)
Addendum  created 03/23/17 1936 by Lowella CurbMiller, Torin Modica Ray, MD   [facility] edited

## 2017-03-26 ENCOUNTER — Encounter (HOSPITAL_COMMUNITY): Admission: RE | Payer: Self-pay | Source: Ambulatory Visit

## 2017-03-26 ENCOUNTER — Inpatient Hospital Stay (HOSPITAL_COMMUNITY): Admission: RE | Admit: 2017-03-26 | Payer: 59 | Source: Ambulatory Visit | Admitting: Obstetrics and Gynecology

## 2017-03-26 SURGERY — Surgical Case
Anesthesia: Regional | Laterality: Bilateral

## 2017-05-31 ENCOUNTER — Encounter (INDEPENDENT_AMBULATORY_CARE_PROVIDER_SITE_OTHER): Payer: Self-pay

## 2017-05-31 ENCOUNTER — Encounter: Payer: Self-pay | Admitting: Primary Care

## 2017-05-31 ENCOUNTER — Ambulatory Visit (INDEPENDENT_AMBULATORY_CARE_PROVIDER_SITE_OTHER): Payer: 59 | Admitting: Primary Care

## 2017-05-31 VITALS — BP 100/66 | HR 54 | Temp 98.4°F | Ht 66.0 in | Wt 175.5 lb

## 2017-05-31 DIAGNOSIS — Z0000 Encounter for general adult medical examination without abnormal findings: Secondary | ICD-10-CM | POA: Insufficient documentation

## 2017-05-31 DIAGNOSIS — Z0001 Encounter for general adult medical examination with abnormal findings: Secondary | ICD-10-CM | POA: Insufficient documentation

## 2017-05-31 LAB — LIPID PANEL
CHOL/HDL RATIO: 2
Cholesterol: 180 mg/dL (ref 0–200)
HDL: 74 mg/dL (ref >39.00)
LDL CALC: 90 mg/dL (ref 0–99)
NONHDL: 106.19
Triglycerides: 82 mg/dL (ref 0.0–149.0)
VLDL: 16.4 mg/dL (ref 0.0–40.0)

## 2017-05-31 LAB — COMPREHENSIVE METABOLIC PANEL
ALK PHOS: 41 U/L (ref 39–117)
ALT: 24 U/L (ref 0–35)
AST: 22 U/L (ref 0–37)
Albumin: 3.9 g/dL (ref 3.5–5.2)
BUN: 11 mg/dL (ref 6–23)
CO2: 31 meq/L (ref 19–32)
Calcium: 9.5 mg/dL (ref 8.4–10.5)
Chloride: 103 mEq/L (ref 96–112)
Creatinine, Ser: 0.66 mg/dL (ref 0.40–1.20)
GFR: 108.24 mL/min (ref >60.00)
GLUCOSE: 78 mg/dL (ref 70–99)
POTASSIUM: 4.5 meq/L (ref 3.5–5.1)
SODIUM: 139 meq/L (ref 135–145)
TOTAL PROTEIN: 7.1 g/dL (ref 6.0–8.3)
Total Bilirubin: 0.4 mg/dL (ref 0.2–1.2)

## 2017-05-31 NOTE — Patient Instructions (Signed)
Stop by the lab prior to leaving today. I will notify you of your results once received.   Start exercising. You should be getting 150 minutes of moderate intensity exercise weekly.  Increase vegetables, fruit, whole grains, lean protein.  Ensure you are consuming 64 ounces of water daily.  It was a pleasure to meet you today! Please don't hesitate to call or message me with any questions. Welcome to [facility]!   Preventive Care 18-39 Years, Female Preventive care refers to lifestyle choices and visits with your health care provider that can promote health and wellness. What does preventive care include?  A yearly physical exam. This is also called an annual well check.  Dental exams once or twice a year.  Routine eye exams. Ask your health care provider how often you should have your eyes checked.  Personal lifestyle choices, including: ? Daily care of your teeth and gums. ? Regular physical activity. ? Eating a healthy diet. ? Avoiding tobacco and drug use. ? Limiting alcohol use. ? Practicing safe sex. ? Taking vitamin and mineral supplements as recommended by your health care provider. What happens during an annual well check? The services and screenings done by your health care provider during your annual well check will depend on your age, overall health, lifestyle risk factors, and family history of disease. Counseling Your health care provider may ask you questions about your:  Alcohol use.  Tobacco use.  Drug use.  Emotional well-being.  Home and relationship well-being.  Sexual activity.  Eating habits.  Work and work Statistician.  Method of birth control.  Menstrual cycle.  Pregnancy history.  Screening You may have the following tests or measurements:  Height, weight, and BMI.  Diabetes screening. This is done by checking your blood sugar (glucose) after you have not eaten for a while (fasting).  Blood pressure.  Lipid and cholesterol  levels. These may be checked every 5 years starting at age 8.  Skin check.  Hepatitis C blood test.  Hepatitis B blood test.  Sexually transmitted disease (STD) testing.  BRCA-related cancer screening. This may be done if you have a family history of breast, ovarian, tubal, or peritoneal cancers.  Pelvic exam and Pap test. This may be done every 3 years starting at age 37. Starting at age 50, this may be done every 5 years if you have a Pap test in combination with an HPV test.  Discuss your test results, treatment options, and if necessary, the need for more tests with your health care provider. Vaccines Your health care provider may recommend certain vaccines, such as:  Influenza vaccine. This is recommended every year.  Tetanus, diphtheria, and acellular pertussis (Tdap, Td) vaccine. You may need a Td booster every 10 years.  Varicella vaccine. You may need this if you have not been vaccinated.  HPV vaccine. If you are 50 or younger, you may need three doses over 6 months.  Measles, mumps, and rubella (MMR) vaccine. You may need at least one dose of MMR. You may also need a second dose.  Pneumococcal 13-valent conjugate (PCV13) vaccine. You may need this if you have certain conditions and were not previously vaccinated.  Pneumococcal polysaccharide (PPSV23) vaccine. You may need one or two doses if you smoke cigarettes or if you have certain conditions.  Meningococcal vaccine. One dose is recommended if you are age 28-21 years and a first-year college student living in a residence hall, or if you have one of several medical conditions. You may  also need additional booster doses.  Hepatitis A vaccine. You may need this if you have certain conditions or if you travel or work in places where you may be exposed to hepatitis A.  Hepatitis B vaccine. You may need this if you have certain conditions or if you travel or work in places where you may be exposed to hepatitis  B.  Haemophilus influenzae type b (Hib) vaccine. You may need this if you have certain risk factors.  Talk to your health care provider about which screenings and vaccines you need and how often you need them. This information is not intended to replace advice given to you by your health care provider. Make sure you discuss any questions you have with your health care provider. Document Released: 04/11/2001 Document Revised: 11/03/2015 Document Reviewed: 12/15/2014 Elsevier Interactive Patient Education  [employer].

## 2017-05-31 NOTE — Assessment & Plan Note (Signed)
Immunizations UTD. Pap smear UTD. Recommended to increase vegetables, fruit, whle grains, lean protein. Recommended regular exercise. Exam unremarkable. Labs pending. Follow up in 1 year.

## 2017-05-31 NOTE — Progress Notes (Signed)
Subjective:    Patient ID: Melinda [name], female    DOB: 11/29/1980, 37 y.o.   MRN: [id number]  HPI  Melinda [name] is a 37 year old female who presents today to establish care and for complete physical.   Immunizations: -Tetanus: Completed in 2016 -Influenza: Did not complete  Diet: She endorses a fair diet Breakfast: Oatmeal, greek yogurt, fruit Lunch: Crackers, sandwich, fruit, vegetable  Dinner: Meat, vegetable, some starch Snacks: Crackers, granola bars, oatmeal pies Desserts: Daily Beverages: Water, coffee  Exercise: She is not currently exercising Eye exam: Completed in 2018 Dental exam: Completes annually  Pap Smear: Completed in 2017    Review of Systems  Constitutional: Negative for unexpected weight change.  HENT: Negative for rhinorrhea.   Respiratory: Negative for cough and shortness of breath.   Cardiovascular: Negative for chest pain.  Gastrointestinal: Negative for constipation and diarrhea.  Genitourinary: Negative for difficulty urinating and menstrual problem.  Musculoskeletal: Negative for arthralgias and myalgias.  Skin: Negative for rash.  Allergic/Immunologic: Negative for environmental allergies.  Neurological: Negative for dizziness, numbness and headaches.  Psychiatric/Behavioral: The patient is not nervous/anxious.        Past Medical History:  Diagnosis Date  . Hypertension    Gestational HTN - Postpartum Mag  . Vaginal Pap smear, abnormal    ASCUS     Social History   Socioeconomic History  . Marital status: Married    Spouse name: Not on file  . Number of children: Not on file  . Years of education: Not on file  . Highest education level: Not on file  Occupational History  . Not on file  Social Needs  . Financial resource strain: Not on file  . Food insecurity:    Worry: Not on file    Inability: Not on file  . Transportation needs:    Medical: Not on file    Non-medical: Not on file  Tobacco Use  . Smoking status:  Former Smoker    Last attempt to quit: 02/06/2010    Years since quitting: 7.3  . Smokeless tobacco: Never Used  Substance and Sexual Activity  . Alcohol use: No  . Drug use: No  . Sexual activity: Yes  Lifestyle  . Physical activity:    Days per week: Not on file    Minutes per session: Not on file  . Stress: Not on file  Relationships  . Social connections:    Talks on phone: Not on file    Gets together: Not on file    Attends religious service: Not on file    Active member of club or organization: Not on file    Attends meetings of clubs or organizations: Not on file    Relationship status: Not on file  . Intimate partner violence:    Fear of current or ex partner: Not on file    Emotionally abused: Not on file    Physically abused: Not on file    Forced sexual activity: Not on file  Other Topics Concern  . Not on file  Social History Narrative  . Not on file    Past Surgical History:  Procedure Laterality Date  . BREAST SURGERY     implants  . CESAREAN SECTION N/A 08/20/2013   Procedure: CESAREAN SECTION;  Surgeon: Dorien Chihuahuaara J. Richardson Doppole, MD;  Location: [facility];  Service: Obstetrics;  Laterality: N/A;  . CESAREAN SECTION N/A 02/08/2015   Procedure: CESAREAN SECTION;  Surgeon: Gerald Leitzara Cole, MD;  [facility];  Service: Obstetrics;  Laterality: N/A;  . CESAREAN SECTION N/A 03/08/2017   Procedure: CESAREAN SECTION;  Surgeon: Gerald Leitz, MD;  Location: [facility] BIRTHING SUITES;  Service: Obstetrics;  Laterality: N/A;    Family History  Problem Relation Age of Onset  . Alcohol abuse Father   . Cancer Maternal Grandmother   . Varicose Veins Maternal Grandmother   . Cancer Paternal Grandmother   . Arthritis Neg Hx   . Asthma Neg Hx   . Birth defects Neg Hx   . COPD Neg Hx   . Depression Neg Hx   . Diabetes Neg Hx   . Drug abuse Neg Hx   . Early death Neg Hx   . Hearing loss Neg Hx   . Heart disease Neg Hx   . Hyperlipidemia Neg Hx   . Hypertension Neg Hx   . Kidney disease  Neg Hx   . Learning disabilities Neg Hx   . Mental illness Neg Hx   . Mental retardation Neg Hx   . Miscarriages / Stillbirths Neg Hx   . Stroke Neg Hx   . Vision loss Neg Hx     No Known Allergies  Current Outpatient Medications on File Prior to Visit  Medication Sig Dispense Refill  . Prenatal Vit-Fe Fumarate-FA (PRENATAL MULTIVITAMIN) TABS tablet Take 1 tablet by mouth daily.      No current facility-administered medications on file prior to visit.     BP 100/66   Pulse (!) 54   Temp 98.4 F (36.9 C) (Oral)   Ht 5\' 6"  (1.676 m)   Wt 175 lb 8 oz (79.6 kg)   LMP 06/26/2016   SpO2 98%   Breastfeeding? Yes   BMI 28.33 kg/m    Objective:   Physical Exam  Constitutional: She is oriented to person, place, and time. She appears well-nourished.  HENT:  Right Ear: Tympanic membrane and ear canal normal.  Left Ear: Tympanic membrane and ear canal normal.  Nose: Nose normal.  Mouth/Throat: Oropharynx is clear and moist.  Eyes: Pupils are equal, round, and reactive to light. Conjunctivae and EOM are normal.  Neck: Neck supple. No thyromegaly present.  Cardiovascular: Normal rate and regular rhythm.  No murmur heard. Pulmonary/Chest: Effort normal and breath sounds normal. She has no rales.  Abdominal: Soft. Bowel sounds are normal. There is no tenderness.  Musculoskeletal: Normal range of motion.  Lymphadenopathy:    She has no cervical adenopathy.  Neurological: She is alert and oriented to person, place, and time. She has normal reflexes. No cranial nerve deficit.  Skin: Skin is warm and dry. No rash noted.  Psychiatric: She has a normal mood and affect.          Assessment & Plan:

## 2018-04-23 ENCOUNTER — Other Ambulatory Visit: Payer: Self-pay | Admitting: Obstetrics and Gynecology

## 2018-04-23 ENCOUNTER — Other Ambulatory Visit (HOSPITAL_COMMUNITY)
Admission: RE | Admit: 2018-04-23 | Discharge: 2018-04-23 | Disposition: A | Discharge status: Home / Self Care | Payer: 59 | Source: Ambulatory Visit | Attending: Obstetrics and Gynecology | Admitting: Obstetrics and Gynecology

## 2018-04-23 DIAGNOSIS — Z01419 Encounter for gynecological examination (general) (routine) without abnormal findings: Secondary | ICD-10-CM | POA: Insufficient documentation

## 2018-04-25 LAB — CYTOLOGY - PAP
DIAGNOSIS: NEGATIVE
HPV (WINDOPATH): NOT DETECTED

## 2018-11-20 ENCOUNTER — Other Ambulatory Visit: Payer: Self-pay

## 2018-11-20 DIAGNOSIS — Z20822 Contact with and (suspected) exposure to covid-19: Secondary | ICD-10-CM

## 2018-11-21 LAB — NOVEL CORONAVIRUS, NAA: SARS-CoV-2, NAA: NOT DETECTED

## 2019-01-26 IMAGING — US US OB COMP LESS 14 WK
1 series · 15 of 28 positions shown · non-contrast
Comparison: None.

CLINICAL DATA: Inappropriate rise in beta HCG levels, 15,878 today
versus 10,000 on [REDACTED]

EXAM:
OBSTETRIC <14 WK US AND TRANSVAGINAL OB US
TECHNIQUE: Both transabdominal and transvaginal ultrasound examinations were
performed for complete evaluation of the gestation as well as the
maternal uterus, adnexal regions, and pelvic cul-de-sac.
Transvaginal technique was performed to assess early pregnancy.

[Series 1: us ob comp less 14 wk · 15 of 100 slices shown]
[im 1/100]
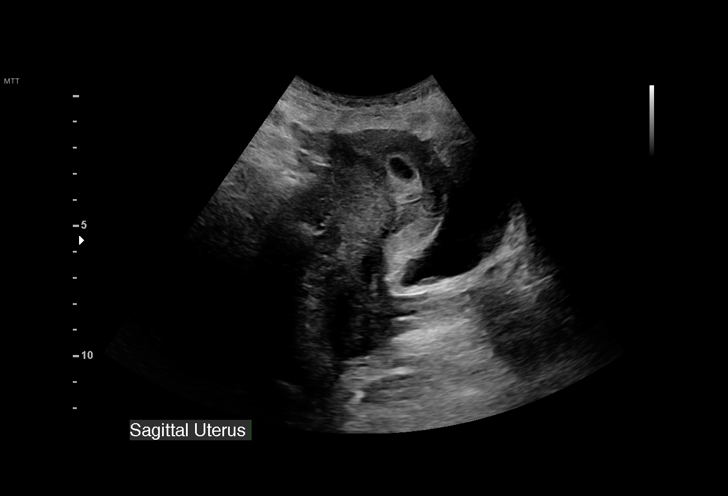
[im 8/100]
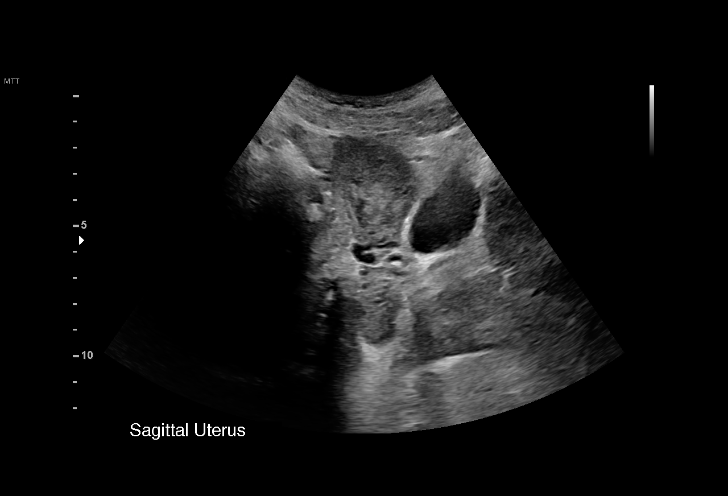
[im 15/100]
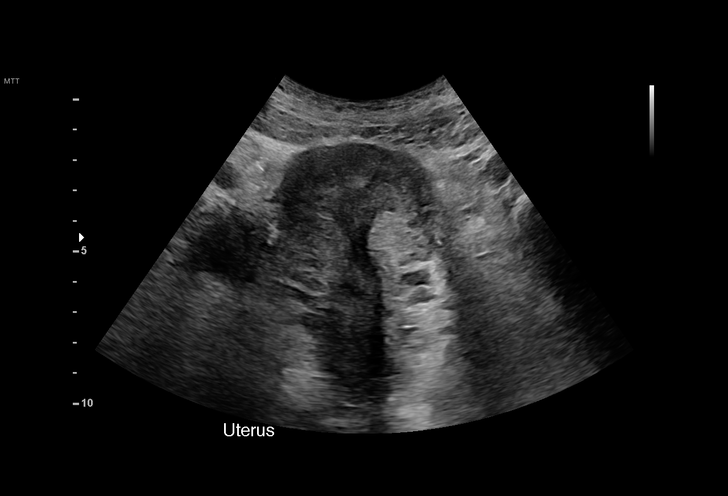
[im 23/100]
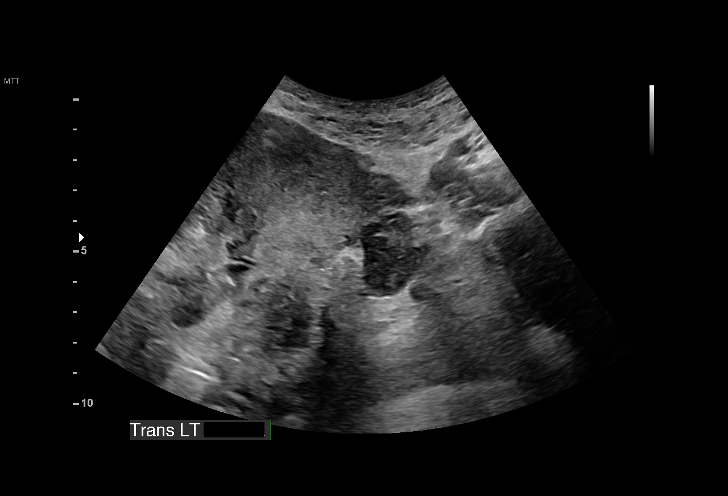
[im 30/100]
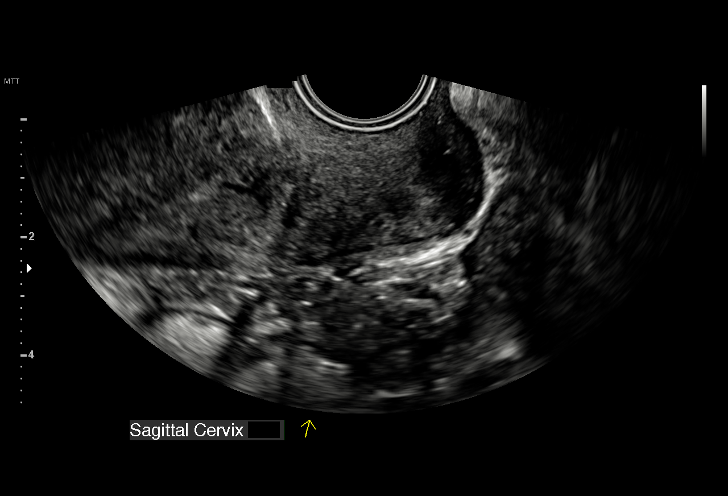
[im 37/100]
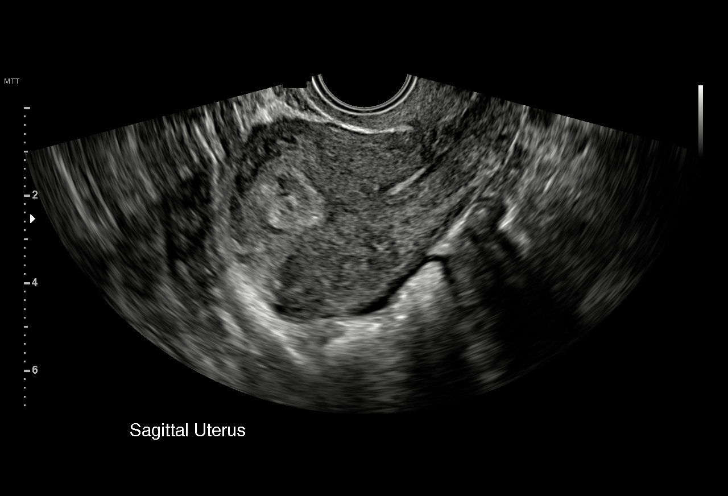
[im 45/100]
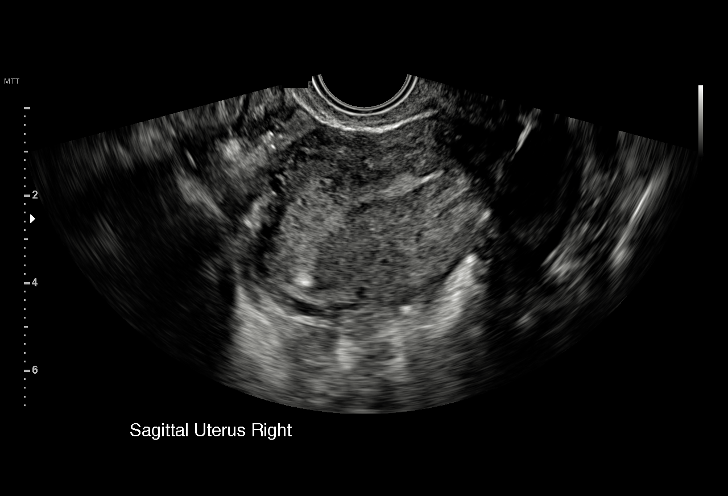
[im 52/100]
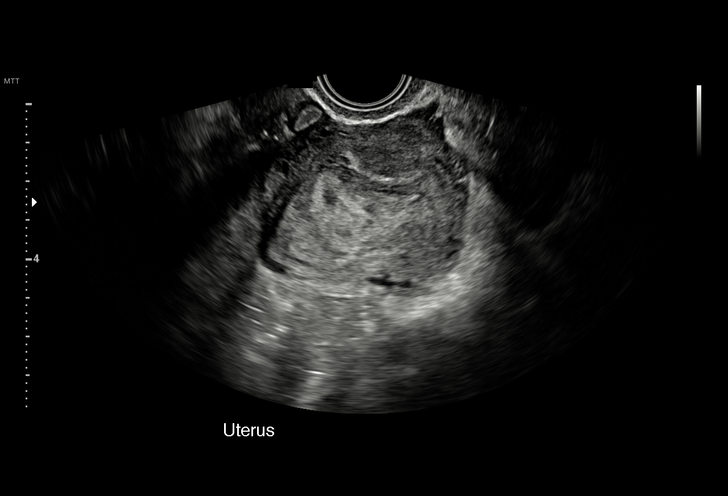
[im 56/100]
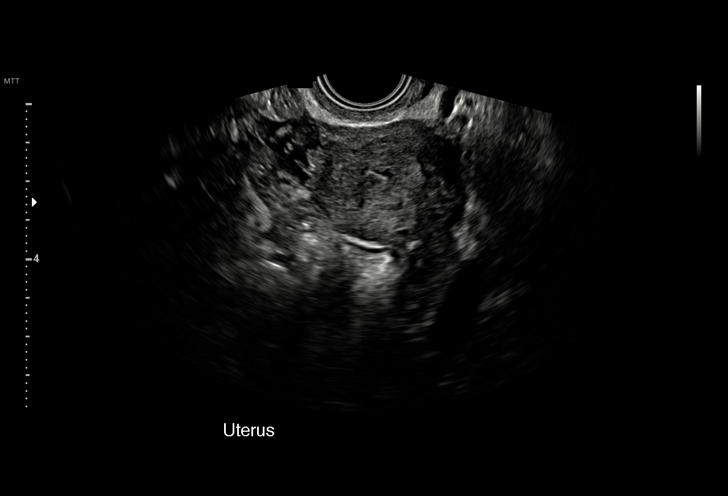
[im 63/100]
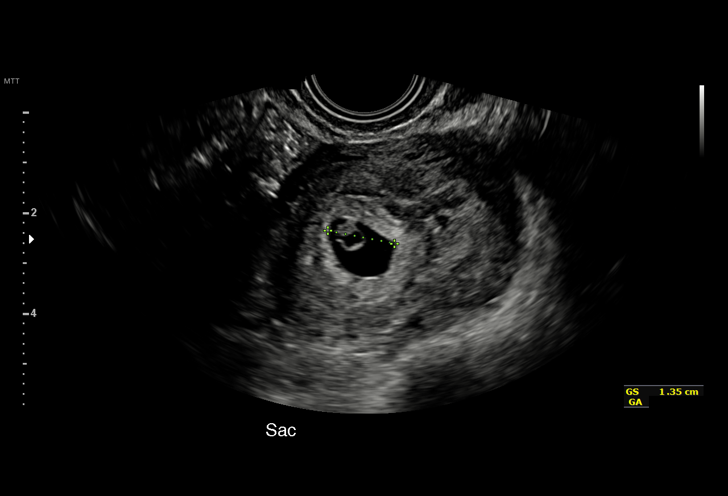
[im 70/100]
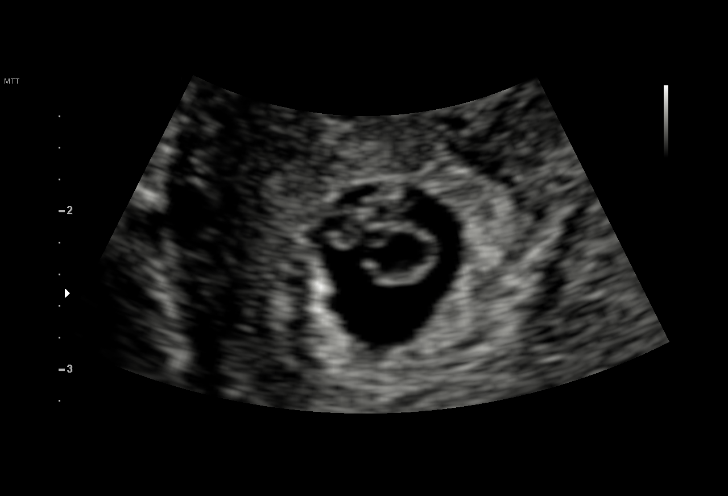
[im 78/100]
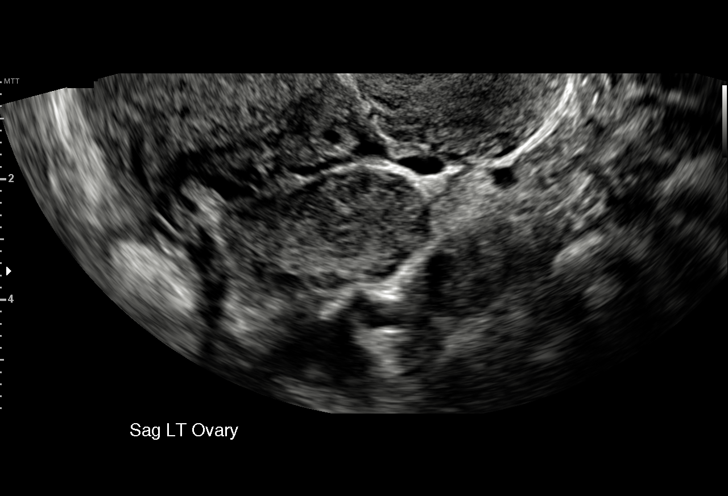
[im 85/100]
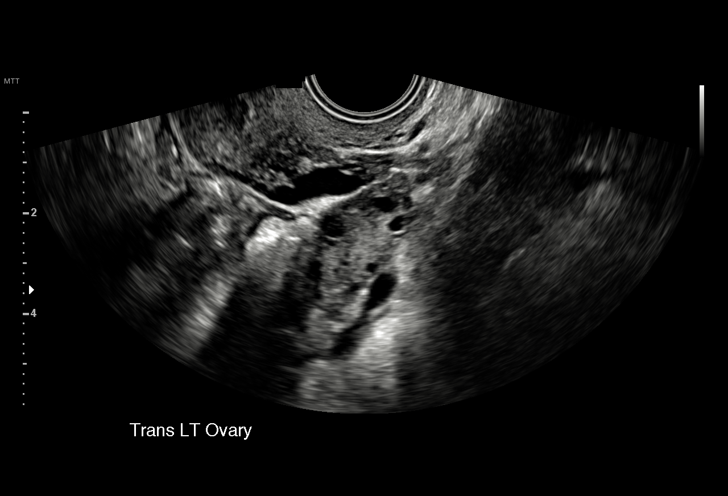
[im 92/100]
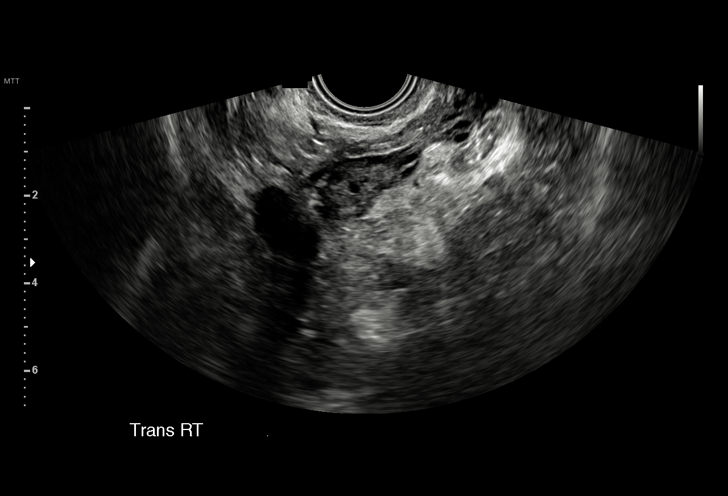
[im 100/100]
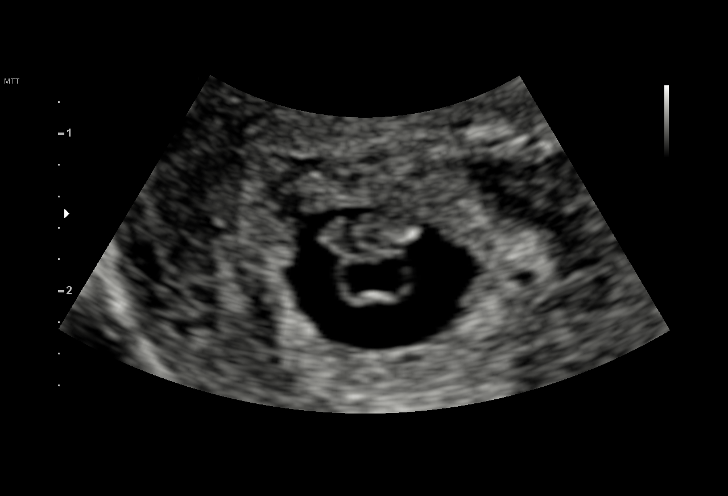

[15 of 28 positions shown; findings below may reference images not displayed]

FINDINGS: Intrauterine gestational sac: Single

Yolk sac:  Visualized.

Embryo:  Visualized.

Cardiac Activity: Visualized

Heart Rate: 119  bpm

CRL:  5.9  mm   6 w   2 d                  US EDC: 04/04/2017

Subchorionic hemorrhage: Small anterior focus of subchorionic
hemorrhage.

Maternal uterus/adnexae: Corpus luteum on the left. Normal right
ovary.
IMPRESSION: An approximately 6 week 2 day viable intrauterine gestation is noted
with ultrasound EDC of 04/04/2017. Small anterior subchorionic
hemorrhage.

## 2019-04-03 ENCOUNTER — Ambulatory Visit: Payer: 59 | Attending: Internal Medicine

## 2019-04-03 DIAGNOSIS — Z20822 Contact with and (suspected) exposure to covid-19: Secondary | ICD-10-CM

## 2019-04-04 LAB — NOVEL CORONAVIRUS, NAA: SARS-CoV-2, NAA: NOT DETECTED

## 2019-06-13 ENCOUNTER — Institutional Professional Consult (permissible substitution): Payer: 59 | Admitting: Plastic Surgery

## 2019-07-25 ENCOUNTER — Ambulatory Visit (INDEPENDENT_AMBULATORY_CARE_PROVIDER_SITE_OTHER): Payer: Self-pay | Admitting: Plastic Surgery

## 2019-07-25 ENCOUNTER — Other Ambulatory Visit: Payer: Self-pay

## 2019-07-25 ENCOUNTER — Encounter: Payer: Self-pay | Admitting: Plastic Surgery

## 2019-07-25 DIAGNOSIS — Z719 Counseling, unspecified: Secondary | ICD-10-CM | POA: Insufficient documentation

## 2019-07-25 NOTE — Progress Notes (Signed)
Patient ID: Melinda [name], female    DOB: 1980-05-06, 39 y.o.   MRN: [id number]   Chief Complaint  Patient presents with  . Advice Only    for revision of augmentation  . Breast Problem    The patient is a 39 year old female here for evaluation for her breasts. The patient underwent bilateral breast augmentation at [facility] around August 2003. Since then she has had 3 children. She is 5 feet 6 inches tall and weighs 155 pounds. There is a family history of breast cancer in her family including her paternal grandmother and a maternal aunt. As far as she knows she has saline implants in place but is not sure about the size. She thinks that they are under the muscle. Prior to the surgery she was in a cup and she is now around a C/D cup. She is interested in having the areola made smaller. She also feels that the right implant is a little bit high and the left breast is a little bit low. She has never had a mammogram. She is willing to get one and would like to do this in [place]. She is also interested in silicone implants this time.   Review of Systems  Constitutional: Negative.  Negative for activity change and appetite change.  HENT: Negative.   Eyes: Negative.   Respiratory: Negative.  Negative for chest tightness and shortness of breath.   Cardiovascular: Negative.  Negative for leg swelling.  Gastrointestinal: Negative.  Negative for abdominal distention and abdominal pain.  Endocrine: Negative.   Genitourinary: Negative.   Musculoskeletal: Negative.   Skin: Negative.   Hematological: Negative.   Psychiatric/Behavioral: Negative.     Past Medical History:  Diagnosis Date  . Hypertension    Gestational HTN - Postpartum Mag  . Preeclampsia, severe    all three pregnancies  . Vaginal Pap smear, abnormal    ASCUS    Past Surgical History:  Procedure Laterality Date  . BREAST SURGERY     implants  . CESAREAN SECTION N/A 08/20/2013   Procedure: CESAREAN  SECTION;  Surgeon: Dorien Chihuahua. Richardson Dopp, MD;  Location: [facility];  Service: Obstetrics;  Laterality: N/A;  . CESAREAN SECTION N/A 02/08/2015   Procedure: CESAREAN SECTION;  Surgeon: Gerald Leitz, MD;  Location: [facility];  Service: Obstetrics;  Laterality: N/A;  . CESAREAN SECTION N/A 03/08/2017   Procedure: CESAREAN SECTION;  Surgeon: Gerald Leitz, MD;  Location: [facility] BIRTHING SUITES;  Service: Obstetrics;  Laterality: N/A;      Current Outpatient Medications:  .  Prenatal Vit-Fe Fumarate-FA (PRENATAL MULTIVITAMIN) TABS tablet, Take 1 tablet by mouth daily. , Disp: , Rfl:    Objective:   Vitals:   07/25/19 0947  BP: 132/82  Pulse: 64  Temp: 98.1 F (36.7 C)  SpO2: 100%    Physical Exam Vitals and nursing note reviewed.  Constitutional:      Appearance: Normal appearance.  HENT:     Head: Normocephalic and atraumatic.  Eyes:     Extraocular Movements: Extraocular movements intact.  Cardiovascular:     Rate and Rhythm: Normal rate.     Pulses: Normal pulses.  Pulmonary:     Effort: Pulmonary effort is normal.  Abdominal:     General: Abdomen is flat. There is no distension.     Palpations: Abdomen is soft.     Tenderness: There is no abdominal tenderness.  Neurological:     General: No focal deficit present.  Mental Status: She is alert and oriented to person, place, and time.  Psychiatric:        Mood and Affect: Mood normal.        Behavior: Behavior normal.        Thought Content: Thought content normal.     Assessment & Plan:  Encounter for counseling  The patient is interested in removal of bilateral saline implants. She would like to be around about the same size. We have sent for her old records to see what size she currently has in. She would like to go with silicone implants this time. She would like the right implant made a little bit lower and the areola a little bit smaller with a mastopexy on both sides. I think this is very reasonable and should give her more longevity  on aesthetic looking breasts. We will provide her with a quote and time to think about it.  Pictures were obtained of the patient and placed in the chart with the patient's or guardian's permission.   [place], DO

## 2019-07-30 ENCOUNTER — Telehealth: Payer: Self-pay

## 2019-07-30 NOTE — Telephone Encounter (Signed)
Called patient to see if she could remember the name, place, or street name where she had the breast augmentation back in 2003 so that we could obtain her medical records. Patient advised she could not remember anything since it was so long ago, and she was younger.  At this time she has decided against having surgery. Advised Dr. Ulice Bold patients decision.

## 2019-08-16 IMAGING — US US MFM OB DETAIL+14 WK
1 series · 14 of 28 positions shown · non-contrast
Comparison: none

[Series 1: us mfm ob detail+14 wk · 14 of 55 slices shown]
[im 3/55]
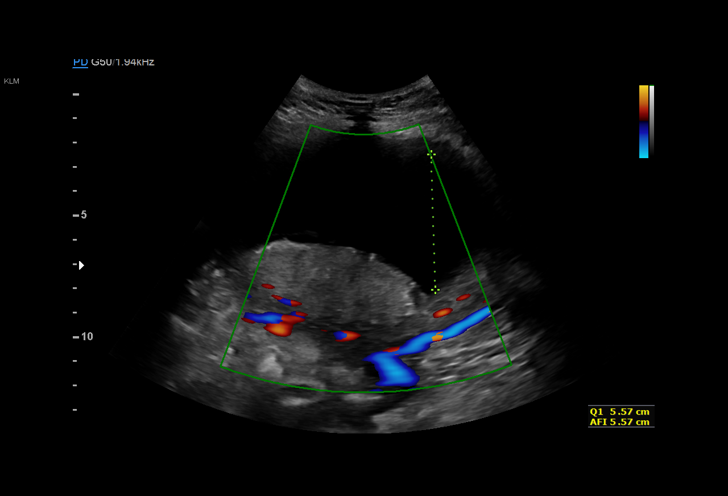
[im 7/55]
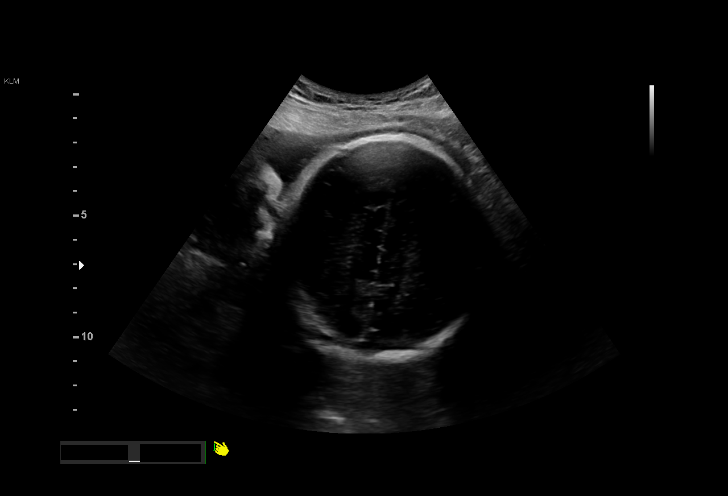
[im 11/55]
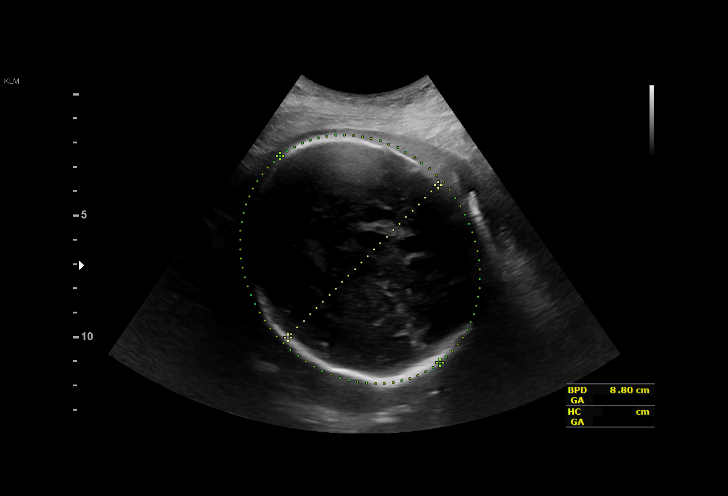
[im 15/55]
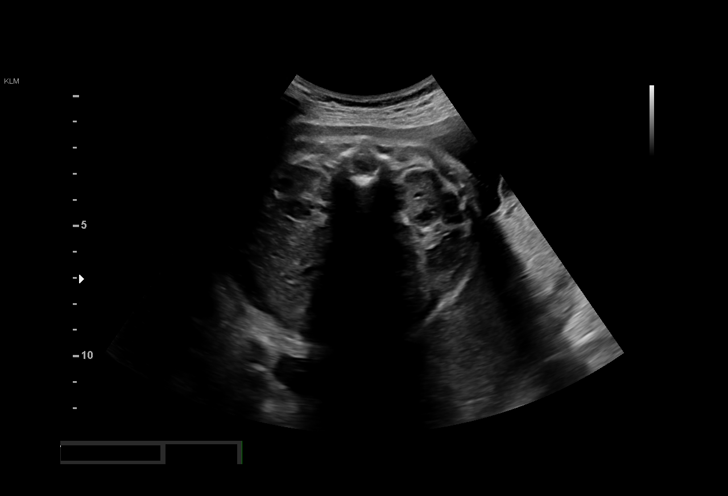
[im 19/55]
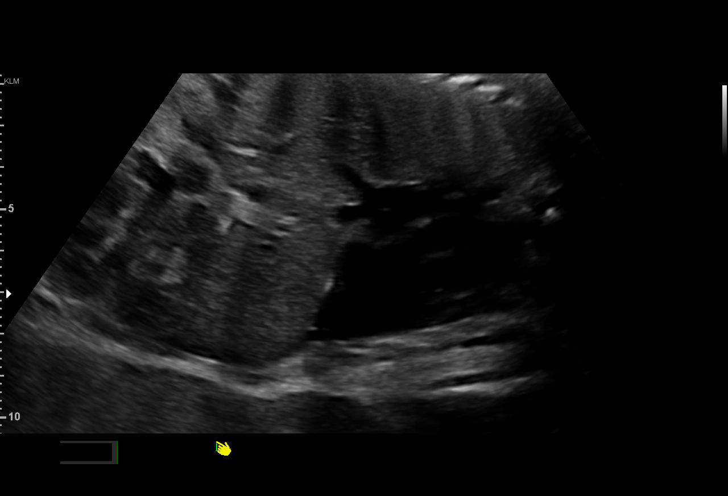
[im 23/55]
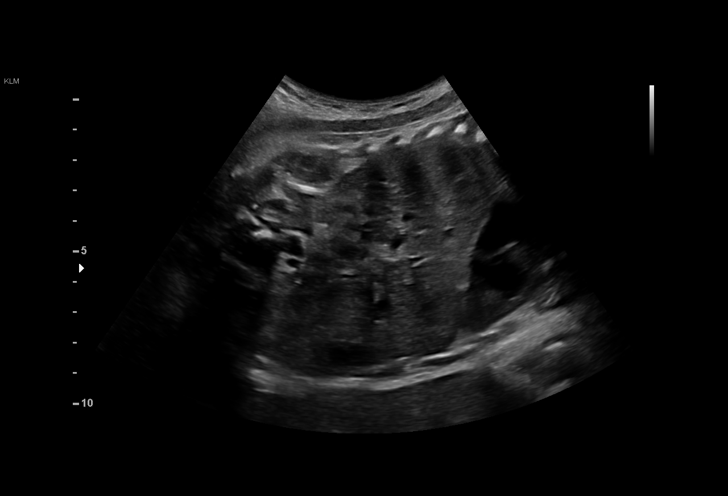
[im 27/55]
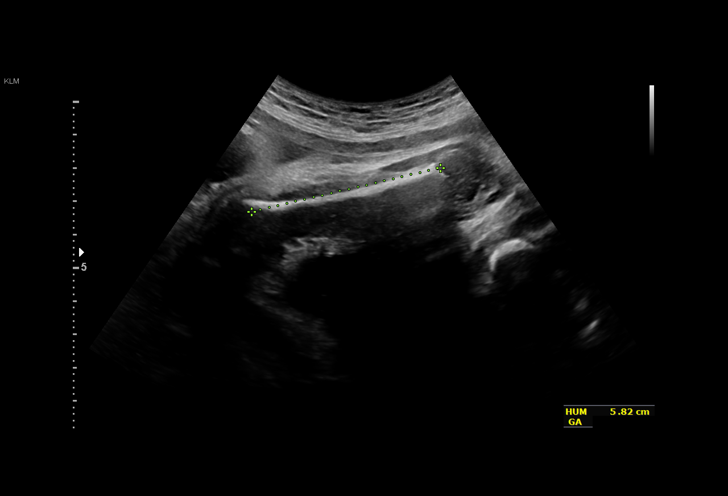
[im 31/55]
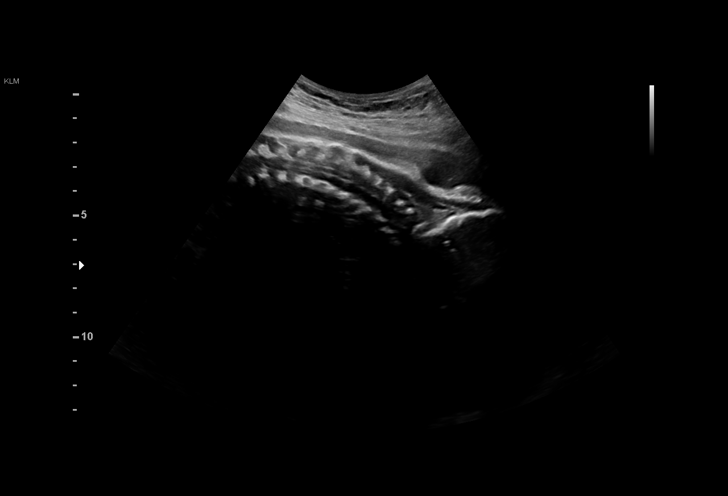
[im 35/55]
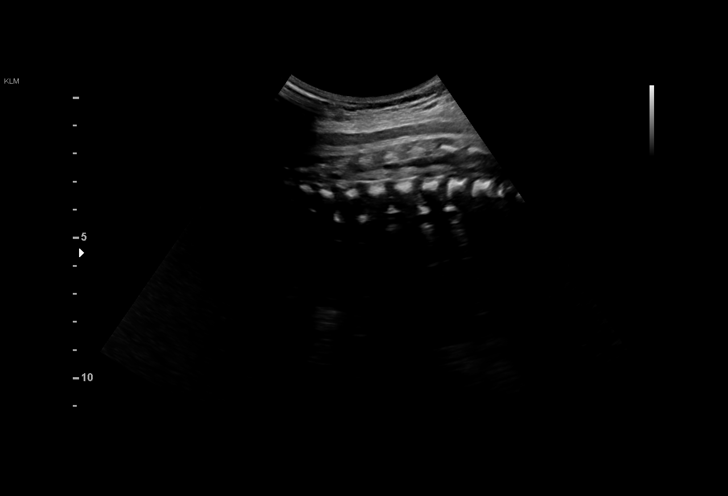
[im 39/55]
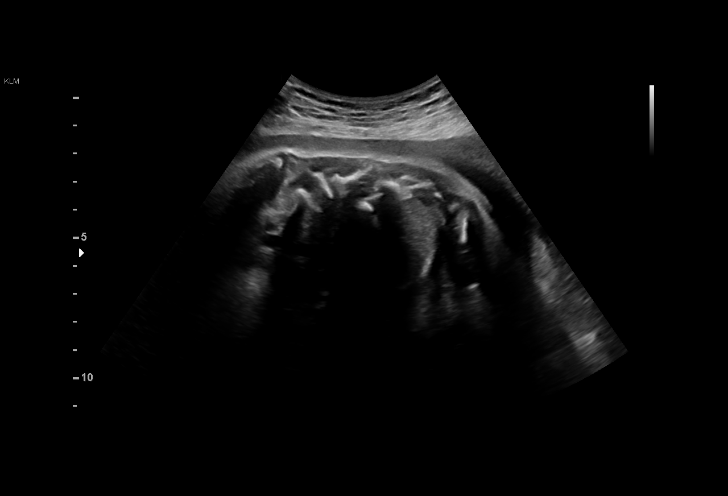
[im 43/55]
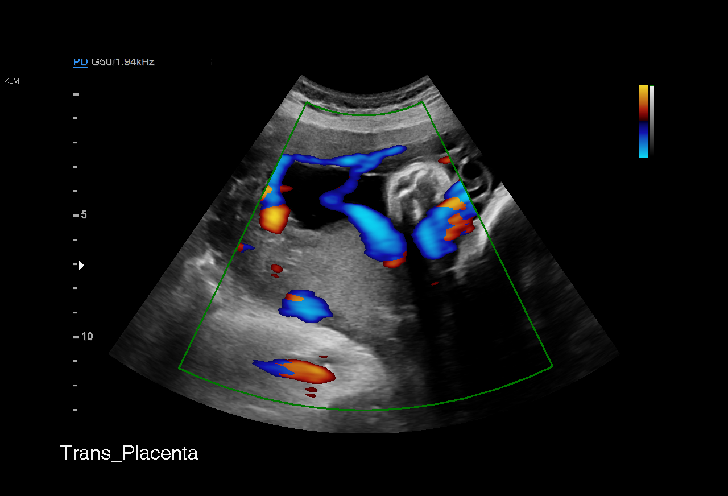
[im 47/55]
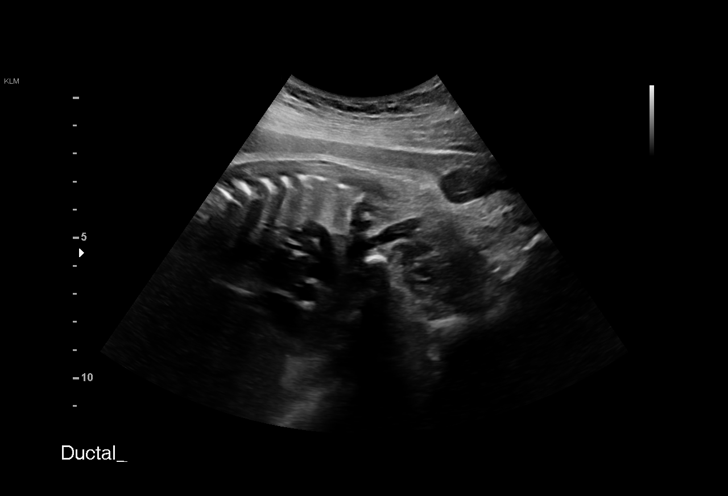
[im 51/55]
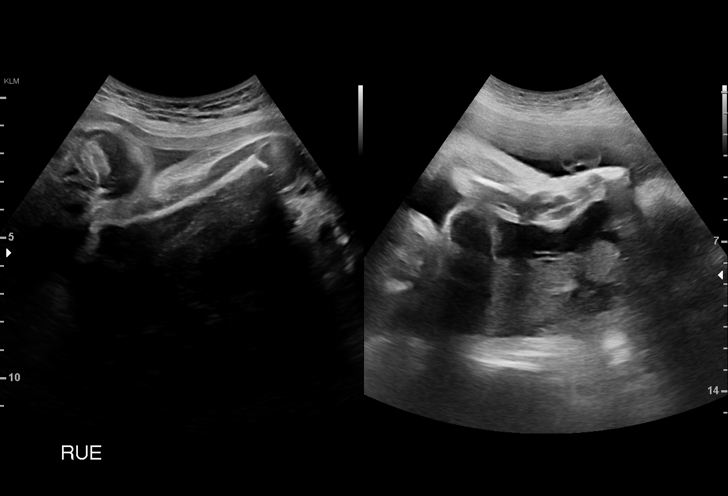
[im 55/55]
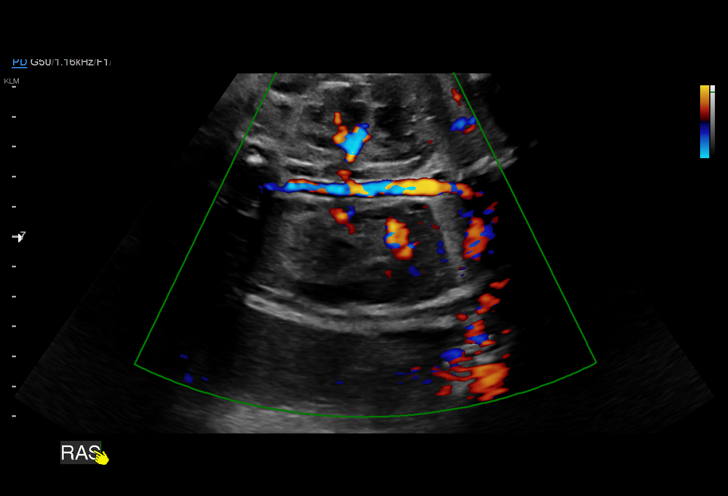

[14 of 28 positions shown; findings below may reference images not displayed]

[REDACTED]
Ave[address] [HOSPITAL]
Attending:        Fil Aldana       Secondary Phy.:   3rd Nursing- HR
OB

1  ISA CLASSEN                [id number]      [id number]     [id number]
Indications

35 weeks gestation of pregnancy
Hypertension - Gestational
Poor obstetric history: Previous preterm
delivery, antepartum (@36 week induction
due to preeclampsia)
Poor obstetric history: Previous
preeclampsia / eclampsia/gestational HTN
Encounter for antenatal screening for
malformations
OB History

Gravidity:    4         Term:   1        Prem:   1        SAB:   1
TOP:          0       Ectopic:  0        Living: 2
Fetal Evaluation

Num Of Fetuses:     1
Fetal Heart         158
Rate(bpm):
Cardiac Activity:   Observed
Presentation:       Cephalic
Placenta:           Posterior, above cervical os
P. Cord Insertion:  Not well visualized

Amniotic Fluid
AFI FV:      Subjectively within normal limits

AFI Sum(cm)     %Tile       Largest Pocket(cm)
16.34           60
RUQ(cm)       RLQ(cm)       LUQ(cm)        LLQ(cm)
5.57
Biometry

BPD:      88.6  mm     G. Age:  35w 6d         66  %    CI:        78.99   %    70 - 86
FL/HC:      21.2   %    20.1 -
HC:      315.2  mm     G. Age:  35w 2d         19  %    HC/AC:      1.03        0.93 -
AC:      305.8  mm     G. Age:  34w 4d         33  %    FL/BPD:     75.4   %    71 - 87
FL:       66.8  mm     G. Age:  34w 3d         19  %    FL/AC:      21.8   %    20 - 24
HUM:      59.5  mm     G. Age:  34w 4d         46  %

Est. FW:    5967  gm      5 lb 8 oz     47  %
Gestational Age

LMP:           35w 3d        Date:  06/26/16                 EDD:   04/02/17
U/S Today:     35w 0d                                        EDD:   04/05/17
Best:          35w 3d     Det. By:  LMP  (06/26/16)          EDD:   04/02/17
Anatomy

Cranium:               Appears normal         Aortic Arch:            Appears normal
Cavum:                 Not well visualized    Ductal Arch:            Appears normal
Ventricles:            Previously seen        Diaphragm:              Appears normal
Choroid Plexus:        Appears normal         Stomach:                Appears normal, left
sided
Cerebellum:            Not well visualized    Abdomen:                Appears normal
Posterior Fossa:       Not well visualized    Abdominal Wall:         Not well visualized
Nuchal Fold:           Not applicable (>20    Cord Vessels:           Appears normal (3
wks GA)                                        vessel cord)
Face:                  Not well visualized    Kidneys:                Appear normal
Lips:                  Not well visualized    Bladder:                Appears normal
Thoracic:              Appears normal         Spine:                  Appears normal
Heart:                 Not well visualized    Upper Extremities:      Visualized
RVOT:                  Not well visualized    Lower Extremities:      Visualized
LVOT:                  Not well visualized

Other:  Fetus appears to be a male. Technically difficult due to advanced
gestational age.
Cervix Uterus Adnexa

Cervix
Not visualized (advanced GA >62wks)
Impression

SIUP at 35+3 weeks
Cephalic presentation
Normal but limited detailed fetal anatomy; no gross
abnormalities
Normal amniotic fluid volume
Measurements consistent with LMP dating; EFW at the 47th
%tile
Recommendations

Follow-up as clinically indicated

## 2019-12-10 ENCOUNTER — Ambulatory Visit: Payer: 59 | Admitting: Dermatology

## 2021-04-05 ENCOUNTER — Encounter: Payer: Self-pay | Admitting: Emergency Medicine

## 2021-04-05 ENCOUNTER — Other Ambulatory Visit: Payer: Self-pay

## 2021-04-05 ENCOUNTER — Ambulatory Visit
Admission: EM | Admit: 2021-04-05 | Discharge: 2021-04-05 | Disposition: A | Discharge status: Home / Self Care | Payer: 59 | Attending: Emergency Medicine | Admitting: Emergency Medicine

## 2021-04-05 DIAGNOSIS — H1033 Unspecified acute conjunctivitis, bilateral: Secondary | ICD-10-CM

## 2021-04-05 DIAGNOSIS — J0100 Acute maxillary sinusitis, unspecified: Secondary | ICD-10-CM

## 2021-04-05 MED ORDER — AMOXICILLIN 875 MG PO TABS: 875.0000 mg | ORAL_TABLET | Freq: Two times a day (BID) | ORAL | 0 refills | Status: AC

## 2021-04-05 MED ORDER — POLYMYXIN B-TRIMETHOPRIM 10000-0.1 UNIT/ML-% OP SOLN: 1.0000 [drp] | Freq: Four times a day (QID) | OPHTHALMIC | 0 refills | Status: AC

## 2021-04-05 NOTE — ED Triage Notes (Signed)
Pt here with bilateral eye swelling and drainage with some intermittent blurriness in the right eye since yesterday.

## 2021-04-05 NOTE — Discharge Instructions (Addendum)
Use the antibiotic eyedrops as prescribed.  Take the amoxicillin as directed.   Follow-up with your primary care provider if your symptoms are not improving.    

## 2021-04-05 NOTE — ED Provider Notes (Signed)
Roderic Palau    CSN: [id number] Arrival date & time: 04/05/21  0802      History   Chief Complaint Chief Complaint  Patient presents with   Eye Problem    HPI Melinda [name] is a 41 y.o. female.  Patient presents with 2-week history of upper respiratory symptoms which became worse 2 days ago.  She reports sinus congestion and sinus pressure.  She also has bilateral eye redness and white-yellow mucus in both eyes since yesterday.  She reports intermittent slight blurriness in her right eye due to the drainage.  No acute eye pain.  No injury.  She denies fever, chills, rash, shortness of breath, vomiting, diarrhea, or other symptoms.  Treatment attempted with OTC cold and sinus medication.  Her medical history includes hypertension.  Patient wears glasses.   The history is provided by the patient and medical records.   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 Landry Mellow, MD;  Location: [place] [facility];  Service: Obstetrics;  Laterality: N/A;   CESAREAN SECTION N/A 02/08/2015   Procedure: CESAREAN SECTION;  Surgeon: Christophe Louis, MD;  Location: [place] [facility];  Service: Obstetrics;  Laterality: N/A;   CESAREAN SECTION N/A 03/08/2017   Procedure: CESAREAN SECTION;  Surgeon: Christophe Louis, MD;  Location: [facility];  Service: Obstetrics;  Laterality: N/A;    OB History     Gravida  4   Para  3   Term  1   Preterm  2    AB  1   Living  3      SAB      IAB      Ectopic      Multiple  0   Live Births  3            Home Medications    Prior to Admission medications   Medication Sig Start Date End Date Taking? Authorizing Provider  amoxicillin (AMOXIL) 875 MG tablet Take 1 tablet (875 mg total) by mouth 2 (two) times daily for 10 days. 04/05/21 04/15/21 Yes Sharion Balloon, NP  trimethoprim-polymyxin b (POLYTRIM) ophthalmic solution Place 1 drop into both eyes 4 (four) times daily for 7 days. 04/05/21 04/12/21 Yes Sharion Balloon, NP  Prenatal Vit-Fe Fumarate-FA (PRENATAL MULTIVITAMIN) TABS tablet Take 1 tablet by mouth daily.     [provider]    Family History Family History  Problem Relation Age of Onset   Alcohol abuse Father    Cancer Maternal Grandmother    Varicose Veins Maternal Grandmother    Cancer Paternal Grandmother    Arthritis Neg Hx    Asthma Neg Hx    Birth defects Neg Hx    COPD Neg Hx    Depression Neg Hx    Diabetes Neg Hx    Drug abuse Neg Hx    Early death Neg Hx  Hearing loss Neg Hx    Heart disease Neg Hx    Hyperlipidemia Neg Hx    Hypertension Neg Hx    Kidney disease Neg Hx    Learning disabilities Neg Hx    Mental illness Neg Hx    Mental retardation Neg Hx    Miscarriages / Stillbirths Neg Hx    Stroke Neg Hx    Vision loss Neg Hx     Social History Social History   Tobacco Use   Smoking status: Former    Types: Cigarettes    Quit date: 02/06/2010    Years since quitting: 11.1   Smokeless tobacco: Never  Substance Use Topics   Alcohol use: No   Drug use: No     Allergies   Patient has no known allergies.   Review of Systems Review of Systems  Constitutional:  Negative for chills and fever.  HENT:  Positive for congestion, postnasal drip and sinus pressure. Negative for ear pain and sore throat.   Eyes:  Positive for discharge, redness and itching. Negative for pain and visual disturbance.  Respiratory:  Positive for  cough. Negative for shortness of breath.   Cardiovascular:  Negative for chest pain and palpitations.  Gastrointestinal:  Negative for diarrhea and vomiting.  Skin:  Negative for color change and rash.  All other systems reviewed and are negative.   Physical Exam Triage Vital Signs ED Triage Vitals [04/05/21 0812]  Enc Vitals Group     BP (!) 152/84     Pulse Rate 69     Resp 18     Temp 97.7 F (36.5 C)     Temp src      SpO2 99 %     Weight      Height      Head Circumference      Peak Flow      Pain Score      Pain Loc      Pain Edu?      Excl. in [place]?    No data found.  Updated Vital Signs BP (!) 152/84    Pulse 69    Temp 97.7 F (36.5 C)    Resp 18    SpO2 99%   Visual Acuity Right Eye Distance: 20/20 Left Eye Distance: 20/20 Bilateral Distance: 20/16  Right Eye Near:   Left Eye Near:    Bilateral Near:     Physical Exam Vitals and nursing note reviewed.  Constitutional:      General: She is not in acute distress.    Appearance: She is well-developed.  HENT:     Right Ear: Tympanic membrane normal.     Left Ear: Tympanic membrane normal.     Nose: Congestion present.     Mouth/Throat:     Mouth: Mucous membranes are moist.     Pharynx: Oropharynx is clear.  Eyes:     General: Lids are normal. Vision grossly intact.     Extraocular Movements: Extraocular movements intact.     Conjunctiva/sclera:     Right eye: Right conjunctiva is injected.     Left eye: Left conjunctiva is injected.     Pupils: Pupils are equal, round, and reactive to light.  Cardiovascular:     Rate and Rhythm: Normal rate and regular rhythm.     Heart sounds: Normal heart sounds.  Pulmonary:     Effort: Pulmonary effort is normal. No respiratory distress.     Breath sounds: Normal breath sounds.  Musculoskeletal:     Cervical back: Neck supple.  Skin:    General: Skin is warm and dry.  Neurological:     Mental Status: She is alert.  Psychiatric:        Mood and Affect:  Mood normal.        Behavior: Behavior normal.     UC Treatments / Results  Labs (all labs ordered are listed, but only abnormal results are displayed) Labs Reviewed - No data to display  EKG   Radiology No results found.  Procedures Procedures (including critical care time)  Medications Ordered in UC Medications - No data to display  Initial Impression / Assessment and Plan / UC Course  I have reviewed the triage vital signs and the nursing notes.  Pertinent labs & imaging results that were available during my care of the patient were reviewed by me and considered in my medical decision making (see chart for details).    Acute bacterial conjunctivitis of both eyes, acute sinusitis.  Treating with amoxicillin and Polytrim eye drops.  Education provided on sinusitis and conjunctivitis.  Instructed patient to follow up with her PCP if her symptoms are not improving.  She agrees to plan of care.    Final Clinical Impressions(s) / UC Diagnoses   Final diagnoses:  Acute bacterial conjunctivitis of both eyes  Acute non-recurrent maxillary sinusitis     Discharge Instructions      Use the antibiotic eyedrops as prescribed.  Take the amoxicillin as directed.  Follow up with your primary care provider if your symptoms are not improving.           ED Prescriptions     Medication Sig Dispense Auth. Provider   trimethoprim-polymyxin b (POLYTRIM) ophthalmic solution Place 1 drop into both eyes 4 (four) times daily for 7 days. 10 mL Sharion Balloon, NP   amoxicillin (AMOXIL) 875 MG tablet Take 1 tablet (875 mg total) by mouth 2 (two) times daily for 10 days. 20 tablet Sharion Balloon, NP      PDMP not reviewed this encounter.   Sharion Balloon, NP 04/05/21 [phone]

## 2021-08-02 ENCOUNTER — Other Ambulatory Visit (HOSPITAL_COMMUNITY)
Admission: RE | Admit: 2021-08-02 | Discharge: 2021-08-02 | Disposition: A | Discharge status: Home / Self Care | Payer: 59 | Source: Ambulatory Visit | Attending: Obstetrics and Gynecology | Admitting: Obstetrics and Gynecology

## 2021-08-02 ENCOUNTER — Other Ambulatory Visit: Payer: Self-pay | Admitting: Obstetrics and Gynecology

## 2021-08-02 DIAGNOSIS — Z01419 Encounter for gynecological examination (general) (routine) without abnormal findings: Secondary | ICD-10-CM | POA: Insufficient documentation

## 2021-08-03 ENCOUNTER — Telehealth: Payer: Self-pay | Admitting: Primary Care

## 2021-08-03 NOTE — Telephone Encounter (Signed)
Yes, of course! Happy to accept her back!

## 2021-08-03 NOTE — Telephone Encounter (Signed)
Lvm for pt to call back and schedule.  

## 2021-08-03 NOTE — Telephone Encounter (Signed)
Pt called and said she last saw Melinda [name] back in 2019 and was wanting to schedule an appt but its been over 3 years and would be considered a new patient. She was wanting to know if she can re-establish with [name]. Is this okay?

## 2021-08-04 LAB — CYTOLOGY - PAP
Comment: NEGATIVE
Diagnosis: NEGATIVE
Diagnosis: REACTIVE
High risk HPV: NEGATIVE

## 2021-09-09 ENCOUNTER — Ambulatory Visit (INDEPENDENT_AMBULATORY_CARE_PROVIDER_SITE_OTHER): Payer: 59 | Admitting: Primary Care

## 2021-09-09 ENCOUNTER — Encounter: Payer: Self-pay | Admitting: Primary Care

## 2021-09-09 VITALS — BP 122/80 | HR 43 | Temp 97.3°F | Ht 66.0 in | Wt 160.0 lb

## 2021-09-09 DIAGNOSIS — Z0001 Encounter for general adult medical examination with abnormal findings: Secondary | ICD-10-CM | POA: Diagnosis not present

## 2021-09-09 DIAGNOSIS — F411 Generalized anxiety disorder: Secondary | ICD-10-CM | POA: Diagnosis not present

## 2021-09-09 DIAGNOSIS — R030 Elevated blood-pressure reading, without diagnosis of hypertension: Secondary | ICD-10-CM

## 2021-09-09 DIAGNOSIS — R001 Bradycardia, unspecified: Secondary | ICD-10-CM | POA: Diagnosis not present

## 2021-09-09 DIAGNOSIS — R7989 Other specified abnormal findings of blood chemistry: Secondary | ICD-10-CM

## 2021-09-09 LAB — COMPREHENSIVE METABOLIC PANEL
ALT: 19 U/L (ref 0–35)
AST: 23 U/L (ref 0–37)
Albumin: 4.4 g/dL (ref 3.5–5.2)
Alkaline Phosphatase: 36 U/L — ABNORMAL LOW (ref 39–117)
BUN: 16 mg/dL (ref 6–23)
CO2: 27 mEq/L (ref 19–32)
Calcium: 9.4 mg/dL (ref 8.4–10.5)
Chloride: 104 mEq/L (ref 96–112)
Creatinine, Ser: 0.77 mg/dL (ref 0.40–1.20)
GFR: 97.18 mL/min (ref >60.00)
Glucose, Bld: 94 mg/dL (ref 70–99)
Potassium: 4 mEq/L (ref 3.5–5.1)
Sodium: 139 mEq/L (ref 135–145)
Total Bilirubin: 0.6 mg/dL (ref 0.2–1.2)
Total Protein: 6.9 g/dL (ref 6.0–8.3)

## 2021-09-09 LAB — CBC
HCT: 38.7 % (ref 36.0–46.0)
Hemoglobin: 12.9 g/dL (ref 12.0–15.0)
MCHC: 33.3 g/dL (ref 30.0–36.0)
MCV: 94.7 fl (ref 78.0–100.0)
Platelets: 246 10*3/uL (ref 150.0–400.0)
RBC: 4.09 Mil/uL (ref 3.87–5.11)
RDW: 13 % (ref 11.5–15.5)
WBC: 5.7 10*3/uL (ref 4.0–10.5)

## 2021-09-09 LAB — LIPID PANEL
Cholesterol: 213 mg/dL — ABNORMAL HIGH (ref 0–200)
HDL: 78.9 mg/dL (ref >39.00)
LDL Cholesterol: 123 mg/dL — ABNORMAL HIGH (ref 0–99)
NonHDL: 134.01
Total CHOL/HDL Ratio: 3
Triglycerides: 55 mg/dL (ref 0.0–149.0)
VLDL: 11 mg/dL (ref 0.0–40.0)

## 2021-09-09 LAB — TSH: TSH: 8.91 u[IU]/mL — ABNORMAL HIGH (ref 0.35–5.50)

## 2021-09-09 NOTE — Assessment & Plan Note (Signed)
Improving.  Continue buspirone 7.5 mg BID.

## 2021-09-09 NOTE — Progress Notes (Signed)
Subjective:    Patient ID: Melinda [name], female    DOB: 14-Sep-1980, 41 y.o.   MRN: [id number]  HPI  Melinda [name] is a very pleasant 41 y.o. female who presents today to re-establish care and for complete physical.   1) Preeclampisa Post Partum: Evaluated by GYN in early June 2023, BP was 150's systolic. Repeat check later during the appointment was 135 systolic.   Since then she's been monitoring her BP at home which is running 110's-120's/70's  BP Readings from Last 3 Encounters:  09/09/21 122/80  04/05/21 (!) 152/84  07/25/19 132/82   2) GAD: Currently managed on buspirone 7.5 mg BID for which was initiated last month per her GYN. Symptoms included worrying, feeling nervous, mind racing thoughts, irritability. Since starting buspirone she's noticed an improvement in her symptoms.   3) Sinus Bradycardia: Chronic for years. She is athletic, exercises daily, also runs 25 miles weekly. She denies dizziness, feeling faint or weak during or after exercise. She tracks her HR on her apple watch which runs in the mid to high 40's at rest and in the 140's with exercise. She has been alerted several times that her HR will drop into the 30's with rest.   She denies a family history of heart disease.    Immunizations: -Tetanus: 2018 -Influenza: Completed last season  -Covid-19: Completed 3 vaccines   Diet: Fair diet.  Exercise: Routine exercise daily   Eye exam: Completes annually  Dental exam: Completes semi-annually   Pap Smear: Completed in 2023  BP Readings from Last 3 Encounters:  09/09/21 122/80  04/05/21 (!) 152/84  07/25/19 132/82        Review of Systems  Constitutional:  Negative for unexpected weight change.  HENT:  Negative for rhinorrhea.   Respiratory:  Negative for cough and shortness of breath.   Cardiovascular:  Negative for chest pain.  Gastrointestinal:  Negative for constipation and diarrhea.  Genitourinary:  Negative for difficulty urinating  and menstrual problem.  Musculoskeletal:  Negative for arthralgias and myalgias.  Skin:  Negative for rash.  Allergic/Immunologic: Negative for environmental allergies.  Neurological:  Negative for dizziness and headaches.  Psychiatric/Behavioral:  The patient is nervous/anxious.          Past Medical History:  Diagnosis Date   Hypertension    Gestational HTN - Postpartum Mag   Preeclampsia in postpartum period 08/27/2013   Preeclampsia, severe    all three pregnancies   Vaginal Pap smear, abnormal    ASCUS    Social History   Socioeconomic History   Marital status: Married    Spouse name: Not on file   Number of children: Not on file   Years of education: Not on file   Highest education level: Not on file  Occupational History   Not on file  Tobacco Use   Smoking status: Former    Types: Cigarettes    Quit date: 02/06/2010    Years since quitting: 11.5   Smokeless tobacco: Never  Substance and Sexual Activity   Alcohol use: No   Drug use: No   Sexual activity: Yes  Other Topics Concern   Not on file  Social History Narrative   Not on file   Social Determinants of Health   Financial Resource Strain: Not on file  Food Insecurity: Not on file  Transportation Needs: Not on file  Physical Activity: Not on file  Stress: Not on file  Social Connections: Not on file  Intimate Partner  Violence: Not on file    Past Surgical History:  Procedure Laterality Date   BREAST SURGERY     implants   CESAREAN SECTION N/A 08/20/2013   Procedure: CESAREAN SECTION;  Surgeon: Dorien Chihuahua. Richardson Dopp, MD;  Location: [facility];  Service: Obstetrics;  Laterality: N/A;   CESAREAN SECTION N/A 02/08/2015   Procedure: CESAREAN SECTION;  Surgeon: Gerald Leitz, MD;  Location: [facility];  Service: Obstetrics;  Laterality: N/A;   CESAREAN SECTION N/A 03/08/2017   Procedure: CESAREAN SECTION;  Surgeon: Gerald Leitz, MD;  Location: [facility];  Service: Obstetrics;  Laterality: N/A;    Family History   Problem Relation Age of Onset   Alcohol abuse Father    Cancer Maternal Grandmother    Varicose Veins Maternal Grandmother    Cancer Paternal Grandmother    Arthritis Neg Hx    Asthma Neg Hx    Birth defects Neg Hx    COPD Neg Hx    Depression Neg Hx    Diabetes Neg Hx    Drug abuse Neg Hx    Early death Neg Hx    Hearing loss Neg Hx    Heart disease Neg Hx    Hyperlipidemia Neg Hx    Hypertension Neg Hx    Kidney disease Neg Hx    Learning disabilities Neg Hx    Mental illness Neg Hx    Mental retardation Neg Hx    Miscarriages / Stillbirths Neg Hx    Stroke Neg Hx    Vision loss Neg Hx     No Known Allergies  Current Outpatient Medications on File Prior to Visit  Medication Sig Dispense Refill   busPIRone (BUSPAR) 7.5 MG tablet Take 7.5 mg by mouth 2 (two) times daily.     No current facility-administered medications on file prior to visit.    BP 122/80 (BP Location: Left Arm, Patient Position: Sitting, Cuff Size: Normal)   Pulse (!) 43   Temp (!) 97.3 F (36.3 C) (Temporal)   Ht 5\' 6"  (1.676 m)   Wt 160 lb (72.6 kg)   SpO2 100%   BMI 25.82 kg/m  Objective:   Physical Exam HENT:     Right Ear: Tympanic membrane and ear canal normal.     Left Ear: Tympanic membrane and ear canal normal.     Nose: Nose normal.  Eyes:     Conjunctiva/sclera: Conjunctivae normal.     Pupils: Pupils are equal, round, and reactive to light.  Neck:     Thyroid: No thyromegaly.  Cardiovascular:     Rate and Rhythm: Regular rhythm. Bradycardia present.     Heart sounds: No murmur heard. Pulmonary:     Effort: Pulmonary effort is normal.     Breath sounds: Normal breath sounds. No rales.  Abdominal:     General: Bowel sounds are normal.     Palpations: Abdomen is soft.     Tenderness: There is no abdominal tenderness.  Musculoskeletal:        General: Normal range of motion.     Cervical back: Neck supple.  Lymphadenopathy:     Cervical: No cervical adenopathy.  Skin:     General: Skin is warm and dry.     Findings: No rash.  Neurological:     Mental Status: She is alert and oriented to person, place, and time.     Cranial Nerves: No cranial nerve deficit.     Deep Tendon Reflexes: Reflexes are normal and symmetric.  Psychiatric:        Mood and Affect: Mood normal.           Assessment & Plan:   Problem List Items Addressed This Visit       Cardiovascular and Mediastinum   Sinus bradycardia    Noted on exam today with initial rate of 44. Typically resting heart rate for patient is in the mid to high 40's as she tracks this on her watch. She has received several alerts on her watch that her rate is too low in the 30's.   She is very athletic so bradycardia could be non concerning, especially as she is asymptomatic.  ECG today with marked sinus bradycardia, rate of 39. No heart block, PAC/PVC noted. Appears different to ECG from 2016.  Given this reading and her prior alerts, will send to cardiology to rule out any potential defect. Referral placed to cardiology   Checking labs today       Relevant Orders   CBC   Comprehensive metabolic panel   TSH   EKG 12-Lead (Completed)   Ambulatory referral to Cardiology   Lipid panel     Other   Elevated BP without diagnosis of hypertension    BP controlled today, also with home readings.  Continue to monitor.       Encounter for annual general medical examination with abnormal findings in adult - Primary    Immunizations UTD. Pap smear UTD  Discussed the importance of a healthy diet and regular exercise in order for weight loss, and to reduce the risk of further co-morbidity.  Exam stable. Labs pending.  Follow up in 1 year for repeat physical.       GAD (generalized anxiety disorder)    Improving.  Continue buspirone 7.5 mg BID.      Relevant Medications   busPIRone (BUSPAR) 7.5 MG tablet       Doreene Nest, NP

## 2021-09-09 NOTE — Patient Instructions (Addendum)
Stop by the lab prior to leaving today. I will notify you of your results once received.   You will be contacted regarding your referral to cardiology.  Please let us know if you have not been contacted within two weeks.   It was a pleasure to see you today!  Preventive Care 21-39 Years Old, Female Preventive care refers to lifestyle choices and visits with your health care provider that can promote health and wellness. Preventive care visits are also called wellness exams. What can I expect for my preventive care visit? Counseling During your preventive care visit, your health care provider may ask about your: Medical history, including: Past medical problems. Family medical history. Pregnancy history. Current health, including: Menstrual cycle. Method of birth control. Emotional well-being. Home life and relationship well-being. Sexual activity and sexual health. Lifestyle, including: Alcohol, nicotine or tobacco, and drug use. Access to firearms. Diet, exercise, and sleep habits. Work and work environment. Sunscreen use. Safety issues such as seatbelt and bike helmet use. Physical exam Your health care provider may check your: Height and weight. These may be used to calculate your BMI (body mass index). BMI is a measurement that tells if you are at a healthy weight. Waist circumference. This measures the distance around your waistline. This measurement also tells if you are at a healthy weight and may help predict your risk of certain diseases, such as type 2 diabetes and high blood pressure. Heart rate and blood pressure. Body temperature. Skin for abnormal spots. What immunizations do I need?  Vaccines are usually given at various ages, according to a schedule. Your health care provider will recommend vaccines for you based on your age, medical history, and lifestyle or other factors, such as travel or where you work. What tests do I need? Screening Your health care  provider may recommend screening tests for certain conditions. This may include: Pelvic exam and Pap test. Lipid and cholesterol levels. Diabetes screening. This is done by checking your blood sugar (glucose) after you have not eaten for a while (fasting). Hepatitis B test. Hepatitis C test. HIV (human immunodeficiency virus) test. STI (sexually transmitted infection) testing, if you are at risk. BRCA-related cancer screening. This may be done if you have a family history of breast, ovarian, tubal, or peritoneal cancers. Talk with your health care provider about your test results, treatment options, and if necessary, the need for more tests. Follow these instructions at home: Eating and drinking  Eat a healthy diet that includes fresh fruits and vegetables, whole grains, lean protein, and low-fat dairy products. Take vitamin and mineral supplements as recommended by your health care provider. Do not drink alcohol if: Your health care provider tells you not to drink. You are pregnant, may be pregnant, or are planning to become pregnant. If you drink alcohol: Limit how much you have to 0-1 drink a day. Know how much alcohol is in your drink. In the [place] one drink equals one 12 oz bottle of beer (355 mL), one 5 oz glass of wine (148 mL), or one 1 oz glass of hard liquor (44 mL). Lifestyle Brush your teeth every morning and night with fluoride toothpaste. Floss one time each day. Exercise for at least 30 minutes 5 or more days each week. Do not use any products that contain nicotine or tobacco. These products include cigarettes, chewing tobacco, and vaping devices, such as e-cigarettes. If you need help quitting, ask your health care provider. Do not use drugs. If you are sexually   active, practice safe sex. Use a condom or other form of protection to prevent STIs. If you do not wish to become pregnant, use a form of birth control. If you plan to become pregnant, see your health care provider  for a prepregnancy visit. Find healthy ways to manage stress, such as: Meditation, yoga, or listening to music. Journaling. Talking to a trusted person. Spending time with friends and family. Minimize exposure to UV radiation to reduce your risk of skin cancer. Safety Always wear your seat belt while driving or riding in a vehicle. Do not drive: If you have been drinking alcohol. Do not ride with someone who has been drinking. If you have been using any mind-altering substances or drugs. While texting. When you are tired or distracted. Wear a helmet and other protective equipment during sports activities. If you have firearms in your house, make sure you follow all gun safety procedures. Seek help if you have been physically or sexually abused. What's next? Go to your health care provider once a year for an annual wellness visit. Ask your health care provider how often you should have your eyes and teeth checked. Stay up to date on all vaccines. This information is not intended to replace advice given to you by your health care provider. Make sure you discuss any questions you have with your health care provider. Document Revised: 08/11/2020 Document Reviewed: 08/11/2020 Elsevier Patient Education  2023 Elsevier Inc.  

## 2021-09-09 NOTE — Assessment & Plan Note (Signed)
BP controlled today, also with home readings.  Continue to monitor.

## 2021-09-09 NOTE — Assessment & Plan Note (Addendum)
Noted on exam today with initial rate of 44. Typically resting heart rate for patient is in the mid to high 40's as she tracks this on her watch. She has received several alerts on her watch that her rate is too low in the 30's.   She is very athletic so bradycardia could be non concerning, especially as she is asymptomatic.  ECG today with marked sinus bradycardia, rate of 39. No heart block, PAC/PVC noted. Appears different to ECG from 2016.  Given this reading and her prior alerts, will send to cardiology to rule out any potential defect. Referral placed to cardiology   Checking labs today

## 2021-09-09 NOTE — Assessment & Plan Note (Signed)
Immunizations UTD. Pap smear UTD  Discussed the importance of a healthy diet and regular exercise in order for weight loss, and to reduce the risk of further co-morbidity.  Exam stable. Labs pending.  Follow up in 1 year for repeat physical.  

## 2021-09-10 DIAGNOSIS — R7989 Other specified abnormal findings of blood chemistry: Secondary | ICD-10-CM

## 2021-09-12 ENCOUNTER — Other Ambulatory Visit: Payer: 59

## 2021-09-12 DIAGNOSIS — R7989 Other specified abnormal findings of blood chemistry: Secondary | ICD-10-CM

## 2021-09-12 NOTE — Addendum Note (Signed)
Addended by: Alvina Chou on: 09/12/2021 03:17 PM   Modules accepted: Orders

## 2021-09-13 LAB — T4, FREE: Free T4: 1.1 ng/dL (ref 0.8–1.8)

## 2021-09-14 ENCOUNTER — Other Ambulatory Visit (INDEPENDENT_AMBULATORY_CARE_PROVIDER_SITE_OTHER): Payer: 59

## 2021-09-14 DIAGNOSIS — R7989 Other specified abnormal findings of blood chemistry: Secondary | ICD-10-CM | POA: Diagnosis not present

## 2021-09-14 LAB — T4, FREE: Free T4: 0.72 ng/dL (ref 0.60–1.60)

## 2021-09-14 LAB — TSH: TSH: 7.86 u[IU]/mL — ABNORMAL HIGH (ref 0.35–5.50)

## 2021-09-14 LAB — T3, FREE: T3, Free: 2.8 pg/mL (ref 2.3–4.2)

## 2021-09-16 ENCOUNTER — Other Ambulatory Visit: Payer: Self-pay | Admitting: Primary Care

## 2021-09-16 DIAGNOSIS — E063 Autoimmune thyroiditis: Secondary | ICD-10-CM

## 2021-09-16 LAB — THYROID PEROXIDASE ANTIBODY: Thyroperoxidase Ab SerPl-aCnc: 197 IU/mL — ABNORMAL HIGH (ref <9)

## 2021-09-16 MED ORDER — LEVOTHYROXINE SODIUM 25 MCG PO TABS: ORAL_TABLET | ORAL | 0 refills | Status: DC

## 2021-11-06 NOTE — Progress Notes (Unsigned)
Cardiology Office Note  Date:  11/07/2021   ID:  Melinda [name], DOB [birth year], MRN [id number]  PCP:  Doreene Nest, NP   Chief Complaint  Patient presents with   New Patient (Initial Visit)    Ref by Vernona Rieger, NP for bradycardia. Medications reviewed by the patient verbally.     HPI:  Melinda [name] is a 41 yo woman with PMH of  Former smoker Who presents by referral from Vernona Rieger for consultation of her bradycardia  Recently seen by primary care, noted to have bradycardia EKG with heart rate 39 bpm Asymptomatic On further discussion today blood pressure typically running in the 120 systolic range Lowest in the 100 teens, to 130 systolic on average Heart rate typically running 40s to 50s at rest, Exercising daily, heart rate up to 150 and higher 170 At baseline low heart rate typically no orthostasis symptoms, Denies near-syncope or syncope  Good exercise tolerance 5 miles at a time, feels good running  CT chest 01/2015: normal study  EKG personally reviewed by myself on todays visit Sinus bradycardia rate 56 bpm no significant ST-T wave changes  PMH:   has a past medical history of Hashimoto's disease, Hypertension, Preeclampsia in postpartum period (08/27/2013), Preeclampsia, severe, Thyroid disease, and Vaginal Pap smear, abnormal.  PSH:    Past Surgical History:  Procedure Laterality Date   BREAST SURGERY     implants   CESAREAN SECTION N/A 08/20/2013   Procedure: CESAREAN SECTION;  Surgeon: Dorien Chihuahua. Richardson Dopp, MD;  Location: [facility];  Service: Obstetrics;  Laterality: N/A;   CESAREAN SECTION N/A 02/08/2015   Procedure: CESAREAN SECTION;  Surgeon: Gerald Leitz, MD;  Location: [facility];  Service: Obstetrics;  Laterality: N/A;   CESAREAN SECTION N/A 03/08/2017   Procedure: CESAREAN SECTION;  Surgeon: Gerald Leitz, MD;  Location: [facility];  Service: Obstetrics;  Laterality: N/A;    Current Outpatient Medications  Medication Sig Dispense Refill    busPIRone (BUSPAR) 7.5 MG tablet Take 7.5 mg by mouth 2 (two) times daily.     levothyroxine (SYNTHROID) 25 MCG tablet Take 1 tablet by mouth every morning on an empty stomach with water only.  No food or other medications for 30 minutes. 90 tablet 0   No current facility-administered medications for this visit.     Allergies:   Patient has no known allergies.   Social History:  The patient  reports that she quit smoking about 11 years ago. Her smoking use included cigarettes. She has never used smokeless tobacco. She reports that she does not drink alcohol and does not use drugs.   Family History:   family history includes Alcohol abuse in her father; Cancer in her maternal grandmother and paternal grandmother; Thyroid disease in her mother; Varicose Veins in her maternal grandmother.    Review of Systems: Review of Systems  Constitutional: Negative.   HENT: Negative.    Respiratory: Negative.    Cardiovascular: Negative.   Gastrointestinal: Negative.   Musculoskeletal: Negative.   Neurological: Negative.   Psychiatric/Behavioral: Negative.    All other systems reviewed and are negative.    PHYSICAL EXAM: VS:  BP (!) 140/80 (BP Location: Left Arm, Patient Position: Sitting, Cuff Size: Normal)   Pulse (!) 56   Ht 5' 6.5" (1.689 m)   Wt 159 lb (72.1 kg)   SpO2 99%   BMI 25.28 kg/m  , BMI Body mass index is 25.28 kg/m. GEN: Well nourished, well developed, in no acute distress  HEENT: normal Neck: no JVD, carotid bruits, or masses Cardiac: RRR; no murmurs, rubs, or gallops,no edema  Respiratory:  clear to auscultation bilaterally, normal work of breathing GI: soft, nontender, nondistended, + BS Melinda: no deformity or atrophy Skin: warm and dry, no rash Neuro:  Strength and sensation are intact Psych: euthymic mood, full affect   Recent Labs: 09/09/2021: ALT 19; BUN 16; Creatinine, Ser 0.77; Hemoglobin 12.9; Platelets 246.0; Potassium 4.0; Sodium 139 09/14/2021: TSH 7.86     Lipid Panel Lab Results  Component Value Date   CHOL 213 (H) 09/09/2021   HDL 78.90 09/09/2021   LDLCALC 123 (H) 09/09/2021   TRIG 55.0 09/09/2021    Wt Readings from Last 3 Encounters:  11/07/21 159 lb (72.1 kg)  09/09/21 160 lb (72.6 kg)  07/25/19 155 lb (70.3 kg)     ASSESSMENT AND PLAN:  Problem List Items Addressed This Visit       Cardiology Problems   Sinus bradycardia - Primary   Asymptomatic bradycardia Good exercise tolerance, good chronotropic competence Heart rate 150 and higher on exertion exercising almost daily Asymptomatic at rest even when heart rates in the 40s No orthostasis symptoms, no near-syncope or syncope Recommend she call us for any worsening orthostasis symptoms in the future Recommend she stay hydrated, try to sit for short period of time before getting up from supine position No strong indication for further work-up, will hold off on echocardiogram and monitors   Patient seen in consultation for Vernona Rieger will be referred back to her office for ongoing care of the issues detailed above  Signed, Dossie Arbour, M.D., Ph.D. [facility] [place], [place] [phone]

## 2021-11-07 ENCOUNTER — Encounter: Payer: Self-pay | Admitting: Cardiovascular Disease

## 2021-11-07 ENCOUNTER — Ambulatory Visit: Payer: 59 | Attending: Cardiovascular Disease | Admitting: Cardiovascular Disease

## 2021-11-07 VITALS — BP 140/80 | HR 56 | Ht 66.5 in | Wt 159.0 lb

## 2021-11-07 DIAGNOSIS — R001 Bradycardia, unspecified: Secondary | ICD-10-CM | POA: Diagnosis not present

## 2021-11-07 NOTE — Patient Instructions (Signed)
Medication Instructions:  No changes  If you need a refill on your cardiac medications before your next appointment, please call your pharmacy.   Lab work: No new labs needed  Testing/Procedures: No new testing needed  Follow-Up: At [facility], you and your health needs are our priority.  As part of our continuing mission to provide you with exceptional heart care, we have created designated Provider Care Teams.  These Care Teams include your primary Cardiologist (physician) and Advanced Practice Providers (APPs -  Physician Assistants and Nurse Practitioners) who all work together to provide you with the care you need, when you need it.  You will need a follow up appointment as needed  Providers on your designated Care Team:   Christopher Berge, NP Ryan Dunn, PA-C Cadence Furth, PA-C  COVID-19 Vaccine Information can be found at: [email or web address] For questions related to vaccine distribution or appointments, please email [email or web address] or call [phone].    

## 2021-11-08 ENCOUNTER — Other Ambulatory Visit: Payer: Self-pay | Admitting: Primary Care

## 2021-11-08 DIAGNOSIS — E038 Other specified hypothyroidism: Secondary | ICD-10-CM

## 2021-11-11 ENCOUNTER — Other Ambulatory Visit (INDEPENDENT_AMBULATORY_CARE_PROVIDER_SITE_OTHER): Payer: 59

## 2021-11-11 DIAGNOSIS — E063 Autoimmune thyroiditis: Secondary | ICD-10-CM | POA: Diagnosis not present

## 2021-11-11 DIAGNOSIS — E038 Other specified hypothyroidism: Secondary | ICD-10-CM

## 2021-11-11 LAB — TSH: TSH: 6.9 u[IU]/mL — ABNORMAL HIGH (ref 0.35–5.50)

## 2021-11-15 ENCOUNTER — Other Ambulatory Visit: Payer: Self-pay | Admitting: Primary Care

## 2021-11-15 ENCOUNTER — Telehealth: Payer: Self-pay | Admitting: Primary Care

## 2021-11-15 DIAGNOSIS — E038 Other specified hypothyroidism: Secondary | ICD-10-CM

## 2021-11-15 MED ORDER — LEVOTHYROXINE SODIUM 50 MCG PO TABS: ORAL_TABLET | ORAL | 0 refills | Status: DC

## 2021-11-15 NOTE — Telephone Encounter (Signed)
Returned patient call and gave lab results.

## 2021-11-15 NOTE — Telephone Encounter (Signed)
Patient returned call back regarding her lab results .

## 2022-01-09 ENCOUNTER — Other Ambulatory Visit: Payer: Self-pay | Admitting: Primary Care

## 2022-01-09 DIAGNOSIS — E038 Other specified hypothyroidism: Secondary | ICD-10-CM

## 2022-01-17 ENCOUNTER — Other Ambulatory Visit (INDEPENDENT_AMBULATORY_CARE_PROVIDER_SITE_OTHER): Payer: 59

## 2022-01-17 DIAGNOSIS — E038 Other specified hypothyroidism: Secondary | ICD-10-CM | POA: Diagnosis not present

## 2022-01-17 DIAGNOSIS — E063 Autoimmune thyroiditis: Secondary | ICD-10-CM | POA: Diagnosis not present

## 2022-01-17 LAB — TSH: TSH: 4.54 u[IU]/mL (ref 0.35–5.50)

## 2022-02-03 DIAGNOSIS — E038 Other specified hypothyroidism: Secondary | ICD-10-CM

## 2022-02-03 MED ORDER — LEVOTHYROXINE SODIUM 75 MCG PO TABS: ORAL_TABLET | ORAL | 0 refills | Status: DC

## 2022-04-17 ENCOUNTER — Other Ambulatory Visit: Payer: Self-pay | Admitting: Primary Care

## 2022-04-17 DIAGNOSIS — E038 Other specified hypothyroidism: Secondary | ICD-10-CM

## 2022-06-26 ENCOUNTER — Other Ambulatory Visit: Payer: Self-pay | Admitting: Primary Care

## 2022-06-26 DIAGNOSIS — E038 Other specified hypothyroidism: Secondary | ICD-10-CM

## 2022-06-26 NOTE — Telephone Encounter (Signed)
Patient is due for CPE/follow up in late July, this will be required prior to any further refills.  Please schedule, thank you!   

## 2022-06-27 NOTE — Telephone Encounter (Signed)
LVM for patient to cb and sch.  

## 2022-09-13 ENCOUNTER — Encounter: Payer: 59 | Admitting: Primary Care

## 2022-09-25 ENCOUNTER — Ambulatory Visit (INDEPENDENT_AMBULATORY_CARE_PROVIDER_SITE_OTHER): Payer: 59 | Admitting: Primary Care

## 2022-09-25 ENCOUNTER — Encounter: Payer: Self-pay | Admitting: Primary Care

## 2022-09-25 VITALS — BP 126/78 | HR 58 | Temp 98.1°F | Ht 66.0 in | Wt 170.0 lb

## 2022-09-25 DIAGNOSIS — F411 Generalized anxiety disorder: Secondary | ICD-10-CM

## 2022-09-25 DIAGNOSIS — E039 Hypothyroidism, unspecified: Secondary | ICD-10-CM

## 2022-09-25 DIAGNOSIS — Z1231 Encounter for screening mammogram for malignant neoplasm of breast: Secondary | ICD-10-CM

## 2022-09-25 DIAGNOSIS — Z0000 Encounter for general adult medical examination without abnormal findings: Secondary | ICD-10-CM

## 2022-09-25 DIAGNOSIS — R001 Bradycardia, unspecified: Secondary | ICD-10-CM

## 2022-09-25 MED ORDER — BUSPIRONE HCL 7.5 MG PO TABS: 7.5000 mg | ORAL_TABLET | Freq: Two times a day (BID) | ORAL | 3 refills | Status: DC

## 2022-09-25 NOTE — Patient Instructions (Addendum)
Stop by the lab prior to leaving today. I will notify you of your results once received.   [facility].   It was a pleasure to see you today!   

## 2022-09-25 NOTE — Assessment & Plan Note (Signed)
She is taking levothyroxine correctly.  Continue levothyroxine 75 mcg daily. Repeat TSH.

## 2022-09-25 NOTE — Assessment & Plan Note (Signed)
Immunizations UTD. Pap smear UTD. Mammogram due, orders placed.  Discussed the importance of a healthy diet and regular exercise in order for weight loss, and to reduce the risk of further co-morbidity.  Exam stable. Labs pending.  Follow up in 1 year for repeat physical.

## 2022-09-25 NOTE — Progress Notes (Signed)
Subjective:    Patient ID: Melinda [name], female    DOB: August 15, 1980, 42 y.o.   MRN: [id number]  HPI  Melinda [name] is a very pleasant 42 y.o. female who presents today for complete physical and follow up of chronic conditions.  Immunizations: -Tetanus: Completed in 2018  Diet: Fair diet.  Exercise: Running 25-35 miles weekly   Eye exam: Completes annually  Dental exam: Completes semi-annually    Pap Smear: Completed in 2023, normal Mammogram: Never completed  BP Readings from Last 3 Encounters:  09/25/22 126/78  11/07/21 (!) 140/80  09/09/21 122/80   Wt Readings from Last 3 Encounters:  09/25/22 170 lb (77.1 kg)  11/07/21 159 lb (72.1 kg)  09/09/21 160 lb (72.6 kg)       Review of Systems  Constitutional:  Negative for unexpected weight change.  HENT:  Negative for rhinorrhea.   Respiratory:  Negative for cough and shortness of breath.   Cardiovascular:  Negative for chest pain.  Gastrointestinal:  Negative for constipation and diarrhea.  Genitourinary:  Negative for difficulty urinating and menstrual problem.  Musculoskeletal:  Negative for arthralgias and myalgias.  Skin:  Negative for rash.  Allergic/Immunologic: Negative for environmental allergies.  Neurological:  Negative for dizziness, numbness and headaches.  Psychiatric/Behavioral:  The patient is not nervous/anxious.          Past Medical History:  Diagnosis Date   Anxiety    Hashimoto's disease    Hypertension    Gestational HTN - Postpartum Mag   Preeclampsia in postpartum period 08/27/2013   Preeclampsia, severe    all three pregnancies   Thyroid disease    Vaginal Pap smear, abnormal    ASCUS    Social History   Socioeconomic History   Marital status: Married    Spouse name: Not on file   Number of children: Not on file   Years of education: Not on file   Highest education level: Not on file  Occupational History   Not on file  Tobacco Use   Smoking status: Former     Current packs/day: 0.00    Types: Cigarettes    Quit date: 02/06/2010    Years since quitting: 12.6   Smokeless tobacco: Never  Vaping Use   Vaping status: Never Used  Substance and Sexual Activity   Alcohol use: Yes    Alcohol/week: 1.0 - 3.0 standard drink of alcohol    Types: 1 - 3 Cans of beer per week   Drug use: No   Sexual activity: Yes    Birth control/protection: None    Comment: Husband had vasectomy  Other Topics Concern   Not on file  Social History Narrative   Not on file   Social Determinants of Health   Financial Resource Strain: Not on file  Food Insecurity: Not on file  Transportation Needs: Not on file  Physical Activity: Not on file  Stress: Not on file  Social Connections: Not on file  Intimate Partner Violence: Not on file    Past Surgical History:  Procedure Laterality Date   BREAST SURGERY     implants   CESAREAN SECTION N/A 08/20/2013   Procedure: CESAREAN SECTION;  Surgeon: Dorien Chihuahua. Richardson Dopp, MD;  Location: [facility];  Service: Obstetrics;  Laterality: N/A;   CESAREAN SECTION N/A 02/08/2015   Procedure: CESAREAN SECTION;  Surgeon: Gerald Leitz, MD;  Location: [facility];  Service: Obstetrics;  Laterality: N/A;   CESAREAN SECTION N/A 03/08/2017   Procedure: CESAREAN  SECTION;  Surgeon: Gerald Leitz, MD;  Location: [facility]IT[facility];  Service: Obstetrics;  Laterality: N/A;   COSMETIC SURGERY  2022    Family History  Problem Relation Age of Onset   Thyroid disease Mother    Alcohol abuse Father    Cancer Maternal Grandmother    Varicose Veins Maternal Grandmother    Cancer Paternal Grandmother    Cancer Maternal Aunt    Arthritis Neg Hx    Asthma Neg Hx    Birth defects Neg Hx    COPD Neg Hx    Depression Neg Hx    Diabetes Neg Hx    Drug abuse Neg Hx    Early death Neg Hx    Hearing loss Neg Hx    Heart disease Neg Hx    Hyperlipidemia Neg Hx    Hypertension Neg Hx    Kidney disease Neg Hx    Learning disabilities Neg Hx    Mental illness Neg Hx     Mental retardation Neg Hx    Miscarriages / Stillbirths Neg Hx    Stroke Neg Hx    Vision loss Neg Hx     No Known Allergies  Current Outpatient Medications on File Prior to Visit  Medication Sig Dispense Refill   levothyroxine (SYNTHROID) 75 MCG tablet TAKE 1 TABLET BY MOUTH EVERY MORNING ON AN EMPTY STOMACH WITH WATER ONLY. NO FOOD OR OTHER MEDICATIONS FOR 30 MINUTES. 90 tablet 0   No current facility-administered medications on file prior to visit.    BP 126/78   Pulse (!) 58   Temp 98.1 F (36.7 C) (Temporal)   Ht 5\' 6"  (1.676 m)   Wt 170 lb (77.1 kg)   SpO2 99%   BMI 27.44 kg/m  Objective:   Physical Exam HENT:     Right Ear: Tympanic membrane and ear canal normal.     Left Ear: Tympanic membrane and ear canal normal.     Nose: Nose normal.  Eyes:     Conjunctiva/sclera: Conjunctivae normal.     Pupils: Pupils are equal, round, and reactive to light.  Neck:     Thyroid: No thyromegaly.  Cardiovascular:     Rate and Rhythm: Normal rate and regular rhythm.     Heart sounds: No murmur heard. Pulmonary:     Effort: Pulmonary effort is normal.     Breath sounds: Normal breath sounds. No rales.  Abdominal:     General: Bowel sounds are normal.     Palpations: Abdomen is soft.     Tenderness: There is no abdominal tenderness.  Musculoskeletal:        General: Normal range of motion.     Cervical back: Neck supple.  Lymphadenopathy:     Cervical: No cervical adenopathy.  Skin:    General: Skin is warm and dry.     Findings: No rash.  Neurological:     Mental Status: She is alert and oriented to person, place, and time.     Cranial Nerves: No cranial nerve deficit.     Deep Tendon Reflexes: Reflexes are normal and symmetric.  Psychiatric:        Mood and Affect: Mood normal.           Assessment & Plan:  Hypothyroidism, unspecified type Assessment & Plan: She is taking levothyroxine correctly.  Continue levothyroxine 75 mcg daily. Repeat  TSH.  Orders: -     TSH -     Comprehensive metabolic panel  GAD (  generalized anxiety disorder) Assessment & Plan: Controlled.  Continue buspirone 7.5 mg BID. Refills provided.   Orders: -     busPIRone HCl; Take 1 tablet (7.5 mg total) by mouth 2 (two) times daily. For anxiety  Dispense: 180 tablet; Refill: 3  Screening mammogram for breast cancer -     3D Screening Mammogram w/Implants, Left and Right; Future  Sinus bradycardia Assessment & Plan: Stable. Evaluated by cardiology.  Continue to monitor.   Preventative health care Assessment & Plan: Immunizations UTD. Pap smear UTD. Mammogram due, orders placed.  Discussed the importance of a healthy diet and regular exercise in order for weight loss, and to reduce the risk of further co-morbidity.  Exam stable. Labs pending.  Follow up in 1 year for repeat physical.          Doreene Nest, NP

## 2022-09-25 NOTE — Assessment & Plan Note (Signed)
Controlled.  Continue buspirone 7.5 mg BID. Refills provided.

## 2022-09-25 NOTE — Assessment & Plan Note (Signed)
Stable. Evaluated by cardiology.  Continue to monitor.

## 2022-10-27 ENCOUNTER — Other Ambulatory Visit: Payer: Self-pay | Admitting: Primary Care

## 2022-10-27 DIAGNOSIS — E038 Other specified hypothyroidism: Secondary | ICD-10-CM

## 2022-11-01 ENCOUNTER — Ambulatory Visit
Admission: RE | Admit: 2022-11-01 | Discharge: 2022-11-01 | Disposition: A | Discharge status: Home / Self Care | Payer: 59 | Source: Ambulatory Visit | Attending: Primary Care | Admitting: Primary Care

## 2022-11-01 DIAGNOSIS — Z1231 Encounter for screening mammogram for malignant neoplasm of breast: Secondary | ICD-10-CM | POA: Diagnosis present

## 2023-08-09 ENCOUNTER — Other Ambulatory Visit: Payer: Self-pay | Admitting: Primary Care

## 2023-08-09 DIAGNOSIS — E063 Autoimmune thyroiditis: Secondary | ICD-10-CM

## 2023-08-09 NOTE — Telephone Encounter (Signed)
 Called  pt and schedule a appt for cpe / labs

## 2023-08-09 NOTE — Telephone Encounter (Signed)
Patient is due for CPE/follow up in early August, this will be required prior to any further refills.  Please schedule, thank you!

## 2023-09-14 ENCOUNTER — Other Ambulatory Visit: Payer: Self-pay | Admitting: Primary Care

## 2023-09-14 DIAGNOSIS — E063 Autoimmune thyroiditis: Secondary | ICD-10-CM

## 2023-09-14 DIAGNOSIS — F411 Generalized anxiety disorder: Secondary | ICD-10-CM

## 2023-09-14 MED ORDER — LEVOTHYROXINE SODIUM 75 MCG PO TABS: ORAL_TABLET | ORAL | 0 refills | Status: DC

## 2023-09-14 NOTE — Telephone Encounter (Unsigned)
 Copied from CRM [id number]. Topic: Clinical - Medication Refill >> Sep 14, 2023 10:40 AM [place] wrote: Medication: levothyroxine  (SYNTHROID ) 75 MCG tablet  Has the patient contacted their pharmacy? Yes (Agent: If no, request that the patient contact the pharmacy for the refill. If patient does not wish to contact the pharmacy document the reason why and proceed with request.) (Agent: If yes, when and what did the pharmacy advise?)  This is the patient's preferred pharmacy:  [id number] - [address] [place] [address] Phone: [phone] Fax: [phone]  Is this the correct pharmacy for this prescription? Yes If no, delete pharmacy and type the correct one.   Has the prescription been filled recently? Yes  Is the patient out of the medication? Yes  Has the patient been seen for an appointment in the last year OR does the patient have an upcoming appointment? Yes  Can we respond through MyChart? Yes  Agent: Please be advised that Rx refills may take up to 3 business days. We ask that you follow-up with your pharmacy.

## 2023-10-03 ENCOUNTER — Other Ambulatory Visit: Payer: Self-pay | Admitting: Primary Care

## 2023-10-03 DIAGNOSIS — Z0000 Encounter for general adult medical examination without abnormal findings: Secondary | ICD-10-CM

## 2023-10-03 DIAGNOSIS — E039 Hypothyroidism, unspecified: Secondary | ICD-10-CM

## 2023-10-16 ENCOUNTER — Other Ambulatory Visit

## 2023-10-16 DIAGNOSIS — E063 Autoimmune thyroiditis: Secondary | ICD-10-CM

## 2023-10-22 ENCOUNTER — Other Ambulatory Visit (INDEPENDENT_AMBULATORY_CARE_PROVIDER_SITE_OTHER)

## 2023-10-22 DIAGNOSIS — Z0000 Encounter for general adult medical examination without abnormal findings: Secondary | ICD-10-CM

## 2023-10-22 DIAGNOSIS — E063 Autoimmune thyroiditis: Secondary | ICD-10-CM | POA: Diagnosis not present

## 2023-10-22 DIAGNOSIS — E039 Hypothyroidism, unspecified: Secondary | ICD-10-CM | POA: Diagnosis not present

## 2023-10-22 LAB — CBC
HCT: 38.9 % (ref 36.0–46.0)
Hemoglobin: 12.8 g/dL (ref 12.0–15.0)
MCHC: 33 g/dL (ref 30.0–36.0)
MCV: 95.4 fl (ref 78.0–100.0)
Platelets: 250 K/uL (ref 150.0–400.0)
RBC: 4.08 Mil/uL (ref 3.87–5.11)
RDW: 12.5 % (ref 11.5–15.5)
WBC: 7.4 K/uL (ref 4.0–10.5)

## 2023-10-22 LAB — COMPREHENSIVE METABOLIC PANEL WITH GFR
ALT: 24 U/L (ref 0–35)
AST: 19 U/L (ref 0–37)
Albumin: 4.5 g/dL (ref 3.5–5.2)
Alkaline Phosphatase: 43 U/L (ref 39–117)
BUN: 20 mg/dL (ref 6–23)
CO2: 26 meq/L (ref 19–32)
Calcium: 9.6 mg/dL (ref 8.4–10.5)
Chloride: 102 meq/L (ref 96–112)
Creatinine, Ser: 0.92 mg/dL (ref 0.40–1.20)
GFR: 77.33 mL/min (ref >60.00)
Glucose, Bld: 79 mg/dL (ref 70–99)
Potassium: 4.1 meq/L (ref 3.5–5.1)
Sodium: 139 meq/L (ref 135–145)
Total Bilirubin: 0.7 mg/dL (ref 0.2–1.2)
Total Protein: 6.9 g/dL (ref 6.0–8.3)

## 2023-10-22 LAB — VITAMIN D 25 HYDROXY (VIT D DEFICIENCY, FRACTURES): VITD: 49.55 ng/mL (ref 30.00–100.00)

## 2023-10-22 LAB — TSH: TSH: 2.49 u[IU]/mL (ref 0.35–5.50)

## 2023-10-22 LAB — LIPID PANEL
Cholesterol: 191 mg/dL (ref 0–200)
HDL: 67.7 mg/dL (ref >39.00)
LDL Cholesterol: 110 mg/dL — ABNORMAL HIGH (ref 0–99)
NonHDL: 123.54
Total CHOL/HDL Ratio: 3
Triglycerides: 66 mg/dL (ref 0.0–149.0)
VLDL: 13.2 mg/dL (ref 0.0–40.0)

## 2023-10-23 ENCOUNTER — Ambulatory Visit: Payer: Self-pay | Admitting: Primary Care

## 2023-10-23 ENCOUNTER — Encounter: Admitting: Primary Care

## 2023-10-31 ENCOUNTER — Ambulatory Visit (INDEPENDENT_AMBULATORY_CARE_PROVIDER_SITE_OTHER): Admitting: Primary Care

## 2023-10-31 ENCOUNTER — Encounter: Payer: Self-pay | Admitting: Primary Care

## 2023-10-31 VITALS — BP 116/70 | HR 48 | Temp 97.4°F | Ht 66.0 in | Wt 135.0 lb

## 2023-10-31 DIAGNOSIS — R001 Bradycardia, unspecified: Secondary | ICD-10-CM | POA: Diagnosis not present

## 2023-10-31 DIAGNOSIS — Z0000 Encounter for general adult medical examination without abnormal findings: Secondary | ICD-10-CM

## 2023-10-31 DIAGNOSIS — E039 Hypothyroidism, unspecified: Secondary | ICD-10-CM | POA: Diagnosis not present

## 2023-10-31 DIAGNOSIS — Z1231 Encounter for screening mammogram for malignant neoplasm of breast: Secondary | ICD-10-CM

## 2023-10-31 DIAGNOSIS — F411 Generalized anxiety disorder: Secondary | ICD-10-CM

## 2023-10-31 NOTE — Assessment & Plan Note (Addendum)
 She is taking levothyroxine  correctly.   Continue levothyroxine  75 mcg daily. Reviewed TSH from August 2025.

## 2023-10-31 NOTE — Assessment & Plan Note (Signed)
 Immunizations UTD.  Declines influenza vaccine Pap smear UTD. Mammogram due, orders placed.  Discussed the importance of a healthy diet and regular exercise in order for weight loss, and to reduce the risk of further co-morbidity.  Exam stable. Labs reviewed.  Follow up in 1 year for repeat physical.

## 2023-10-31 NOTE — Patient Instructions (Signed)
[facility] your [facility].   It was a pleasure to see you today!   

## 2023-10-31 NOTE — Progress Notes (Signed)
 Subjective:    Patient ID: Melinda [name], female    DOB: Feb 11, 1981, 43 y.o.   MRN: [id number]  Melinda [name] is a very pleasant 43 y.o. female who presents today for complete physical and follow up of chronic conditions.  She began tirzepetitde compounded GLP 1 agonist treatment in March 2025 through an online ran out one week ago. Was managed on half the dose for the most part. She has noticed reduced inflammation in her joints, eating less, not thinking about food.   Immunizations: -Tetanus: Completed in 2018 -Influenza: Declines   Diet: Healthy diet. Exercise: Regular exercise.  Eye exam: Completes every 2 years Dental exam: Completes semi-annually    Pap Smear: Completed in 2023, normal  Mammogram: Completed in September 2024  BP Readings from Last 3 Encounters:  10/31/23 116/70  09/25/22 126/78  11/07/21 (!) 140/80    Wt Readings from Last 3 Encounters:  10/31/23 135 lb (61.2 kg)  09/25/22 170 lb (77.1 kg)  11/07/21 159 lb (72.1 kg)      Review of Systems  Constitutional:  Negative for unexpected weight change.  HENT:  Negative for rhinorrhea.   Respiratory:  Negative for cough and shortness of breath.   Cardiovascular:  Negative for chest pain.  Gastrointestinal:  Negative for constipation and diarrhea.  Genitourinary:  Negative for difficulty urinating and menstrual problem.  Musculoskeletal:  Negative for arthralgias and myalgias.  Skin:  Negative for rash.  Allergic/Immunologic: Negative for environmental allergies.  Neurological:  Negative for dizziness and headaches.  Psychiatric/Behavioral:  The patient is not nervous/anxious.          Past Medical History:  Diagnosis Date   Anxiety    Hashimoto's disease    Hypertension    Gestational HTN - Postpartum Mag   Preeclampsia in postpartum period 08/27/2013   Preeclampsia, severe    all three pregnancies   Thyroid  disease    Vaginal Pap smear, abnormal    ASCUS    Social History    Socioeconomic History   Marital status: Married    Spouse name: Not on file   Number of children: Not on file   Years of education: Not on file   Highest education level: Not on file  Occupational History   Not on file  Tobacco Use   Smoking status: Former    Current packs/day: 0.00    Types: Cigarettes    Quit date: 02/06/2010    Years since quitting: 13.7   Smokeless tobacco: Never  Vaping Use   Vaping status: Never Used  Substance and Sexual Activity   Alcohol use: Yes    Alcohol/week: 1.0 - 3.0 standard drink of alcohol    Types: 1 - 3 Cans of beer per week   Drug use: No   Sexual activity: Yes    Birth control/protection: None    Comment: Husband had vasectomy  Other Topics Concern   Not on file  Social History Narrative   Not on file   Social Drivers of Health   Financial Resource Strain: Not on file  Food Insecurity: Not on file  Transportation Needs: Not on file  Physical Activity: Not on file  Stress: Not on file  Social Connections: Not on file  Intimate Partner Violence: Not on file    Past Surgical History:  Procedure Laterality Date   AUGMENTATION MAMMAPLASTY     2003 and revision 2022. Pt also had areola reduction   CESAREAN SECTION N/A 08/20/2013   Procedure: CESAREAN  SECTION;  Surgeon: Rexene PARAS. Rosalva, MD;  Location: [facility];  Service: Obstetrics;  Laterality: N/A;   CESAREAN SECTION N/A 02/08/2015   Procedure: CESAREAN SECTION;  Surgeon: Rexene Rosalva, MD;  Location: [facility];  Service: Obstetrics;  Laterality: N/A;   CESAREAN SECTION N/A 03/08/2017   Procedure: CESAREAN SECTION;  Surgeon: Rosalva Rexene, MD;  Location: [facility];  Service: Obstetrics;  Laterality: N/A;   COSMETIC SURGERY  2022    Family History  Problem Relation Age of Onset   Thyroid  disease Mother    Alcohol abuse Father    Breast cancer Maternal Aunt 9   Cancer Maternal Aunt    Breast cancer Maternal Grandmother 14   Cancer Maternal Grandmother    Varicose Veins  Maternal Grandmother    Cancer Paternal Grandmother    Arthritis Neg Hx    Asthma Neg Hx    Birth defects Neg Hx    COPD Neg Hx    Depression Neg Hx    Diabetes Neg Hx    Drug abuse Neg Hx    Early death Neg Hx    Hearing loss Neg Hx    Heart disease Neg Hx    Hyperlipidemia Neg Hx    Hypertension Neg Hx    Kidney disease Neg Hx    Learning disabilities Neg Hx    Mental illness Neg Hx    Mental retardation Neg Hx    Miscarriages / Stillbirths Neg Hx    Stroke Neg Hx    Vision loss Neg Hx     No Known Allergies  Current Outpatient Medications on File Prior to Visit  Medication Sig Dispense Refill   busPIRone  (BUSPAR ) 7.5 MG tablet TAKE 1 TABLET (7.5 MG TOTAL) BY MOUTH 2 (TWO) TIMES DAILY FOR ANXIETY 180 tablet 0   levothyroxine  (SYNTHROID ) 75 MCG tablet Take 1 tablet by mouth every morning on an empty stomach with water only.  No food or other medications for 30 minutes. 90 tablet 0   No current facility-administered medications on file prior to visit.    BP 116/70   Pulse (!) 48   Temp (!) 97.4 F (36.3 C) (Temporal)   Ht 5' 6 (1.676 m)   Wt 135 lb (61.2 kg)   LMP 10/15/2023   SpO2 100%   Breastfeeding No   BMI 21.79 kg/m  Objective:   Physical Exam HENT:     Right Ear: Tympanic membrane and ear canal normal.     Left Ear: Tympanic membrane and ear canal normal.  Eyes:     Pupils: Pupils are equal, round, and reactive to light.  Cardiovascular:     Rate and Rhythm: Normal rate and regular rhythm.  Pulmonary:     Effort: Pulmonary effort is normal.     Breath sounds: Normal breath sounds.  Abdominal:     General: Bowel sounds are normal.     Palpations: Abdomen is soft.     Tenderness: There is no abdominal tenderness.  Musculoskeletal:        General: Normal range of motion.     Cervical back: Neck supple.  Skin:    General: Skin is warm and dry.  Neurological:     Mental Status: She is alert and oriented to person, place, and time.     Cranial  Nerves: No cranial nerve deficit.     Deep Tendon Reflexes:     Reflex Scores:      Patellar reflexes are 2+ on the right side  and 2+ on the left side. Psychiatric:        Mood and Affect: Mood normal.     Physical Exam        Assessment & Plan:  Preventative health care Assessment & Plan: Immunizations UTD.  Declines influenza vaccine Pap smear UTD. Mammogram due, orders placed.  Discussed the importance of a healthy diet and regular exercise in order for weight loss, and to reduce the risk of further co-morbidity.  Exam stable. Labs reviewed.  Follow up in 1 year for repeat physical.    GAD (generalized anxiety disorder) Assessment & Plan: Controlled.  Continue buspirone  7.5 mg BID.    Hypothyroidism, unspecified type Assessment & Plan: She is taking levothyroxine  correctly.   Continue levothyroxine  75 mcg daily. Reviewed TSH from August 2025.   Sinus bradycardia Assessment & Plan: Stable.  Continue to monitor.   Screening mammogram for breast cancer -     3D Screening Mammogram w/Implants, Left and Right; Future    Assessment and Plan Assessment & Plan         Comer MARLA Gaskins, NP   History of Present Illness

## 2023-10-31 NOTE — Assessment & Plan Note (Signed)
 Controlled.  Continue buspirone  7.5 mg BID.

## 2023-10-31 NOTE — Assessment & Plan Note (Signed)
 Stable.       - Continue to monitor

## 2024-01-29 ENCOUNTER — Inpatient Hospital Stay: Admission: RE | Admit: 2024-01-29 | Discharge: 2024-01-29 | Attending: Primary Care

## 2024-01-29 DIAGNOSIS — Z1231 Encounter for screening mammogram for malignant neoplasm of breast: Secondary | ICD-10-CM | POA: Insufficient documentation

## 2024-02-05 ENCOUNTER — Ambulatory Visit: Payer: Self-pay | Admitting: Primary Care

## 2024-02-08 ENCOUNTER — Other Ambulatory Visit: Payer: Self-pay | Admitting: Primary Care

## 2024-02-08 DIAGNOSIS — F411 Generalized anxiety disorder: Secondary | ICD-10-CM

## 2024-03-05 ENCOUNTER — Encounter

## 2024-03-07 ENCOUNTER — Other Ambulatory Visit: Payer: Self-pay | Admitting: Primary Care

## 2024-03-07 DIAGNOSIS — E063 Autoimmune thyroiditis: Secondary | ICD-10-CM
